# Patient Record
Sex: Male | Born: 1990 | Race: White | Hispanic: No | Marital: Married | Smoking: Never smoker
Health system: Southern US, Community
[De-identification: ages and names within clinical notes are randomized; demographics above are authoritative.]

## PROBLEM LIST (undated history)

## (undated) DIAGNOSIS — F909 Attention-deficit hyperactivity disorder, unspecified type: Secondary | ICD-10-CM

## (undated) DIAGNOSIS — F1910 Other psychoactive substance abuse, uncomplicated: Secondary | ICD-10-CM

## (undated) DIAGNOSIS — F1410 Cocaine abuse, uncomplicated: Secondary | ICD-10-CM

---

## 2012-10-05 ENCOUNTER — Emergency Department (HOSPITAL_COMMUNITY)
Admission: EM | Admit: 2012-10-05 | Discharge: 2012-10-05 | Disposition: A | Discharge status: Home / Self Care | Payer: Self-pay | Attending: Emergency Medicine | Admitting: Emergency Medicine

## 2012-10-05 ENCOUNTER — Other Ambulatory Visit: Payer: Self-pay

## 2012-10-05 ENCOUNTER — Encounter (HOSPITAL_COMMUNITY): Payer: Self-pay | Admitting: Emergency Medicine

## 2012-10-05 DIAGNOSIS — R000 Tachycardia, unspecified: Secondary | ICD-10-CM | POA: Insufficient documentation

## 2012-10-05 DIAGNOSIS — F17200 Nicotine dependence, unspecified, uncomplicated: Secondary | ICD-10-CM | POA: Insufficient documentation

## 2012-10-05 DIAGNOSIS — F411 Generalized anxiety disorder: Secondary | ICD-10-CM | POA: Insufficient documentation

## 2012-10-05 LAB — CBC WITH DIFFERENTIAL/PLATELET
Basophils Absolute: 0 10*3/uL (ref 0.0–0.1)
Basophils Relative: 0 % (ref 0–1)
Eosinophils Relative: 1 % (ref 0–5)
HCT: 41.8 % (ref 39.0–52.0)
MCHC: 35.4 g/dL (ref 30.0–36.0)
MCV: 83.4 fL (ref 78.0–100.0)
Monocytes Absolute: 0.8 10*3/uL (ref 0.1–1.0)
Platelets: 298 10*3/uL (ref 150–400)
RDW: 12.4 % (ref 11.5–15.5)

## 2012-10-05 LAB — ETHANOL: Alcohol, Ethyl (B): <11 mg/dL (ref 0–11)

## 2012-10-05 LAB — BASIC METABOLIC PANEL
Calcium: 9.3 mg/dL (ref 8.4–10.5)
Creatinine, Ser: 0.96 mg/dL (ref 0.50–1.35)
GFR calc Af Amer: >90 mL/min (ref >90)
GFR calc non Af Amer: >90 mL/min (ref >90)

## 2012-10-05 MED ORDER — ADENOSINE 6 MG/2ML IV SOLN
12.0000 mg | Freq: Once | INTRAVENOUS | Status: AC
  Administered 2012-10-05: 12 mg via INTRAVENOUS
  Filled 2012-10-05: qty 4

## 2012-10-05 MED ORDER — SODIUM CHLORIDE 0.9 % IV BOLUS (SEPSIS)
1000.0000 mL | Freq: Once | INTRAVENOUS | Status: AC
  Administered 2012-10-05: 1000 mL via INTRAVENOUS

## 2012-10-05 MED ORDER — METOPROLOL TARTRATE 1 MG/ML IV SOLN
5.0000 mg | Freq: Once | INTRAVENOUS | Status: AC
  Administered 2012-10-05: 5 mg via INTRAVENOUS
  Filled 2012-10-05: qty 5
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
## 2013-05-09 ENCOUNTER — Encounter (HOSPITAL_COMMUNITY): Payer: Self-pay | Admitting: Emergency Medicine

## 2013-05-09 ENCOUNTER — Emergency Department (INDEPENDENT_AMBULATORY_CARE_PROVIDER_SITE_OTHER)
Admission: EM | Admit: 2013-05-09 | Discharge: 2013-05-09 | Disposition: A | Discharge status: Home / Self Care | Payer: Self-pay | Source: Home / Self Care

## 2013-05-09 DIAGNOSIS — A088 Other specified intestinal infections: Secondary | ICD-10-CM

## 2013-05-09 DIAGNOSIS — A084 Viral intestinal infection, unspecified: Secondary | ICD-10-CM

## 2013-05-09 MED ORDER — ONDANSETRON 4 MG PO TBDP
ORAL_TABLET | ORAL | Status: AC
  Filled 2013-05-09: qty 1

## 2013-05-09 MED ORDER — ONDANSETRON HCL 4 MG PO TABS: 4.0000 mg | ORAL_TABLET | Freq: Four times a day (QID) | ORAL | Status: DC

## 2013-05-09 MED ORDER — ONDANSETRON 4 MG PO TBDP
4.0000 mg | ORAL_TABLET | Freq: Once | ORAL | Status: AC
  Administered 2013-05-09: 4 mg via ORAL

## 2013-05-09 NOTE — ED Notes (Signed)
Vomiting and diarrhea since 5 am.

## 2013-05-09 NOTE — ED Provider Notes (Signed)
CSN: [id number]     Arrival date & time 05/09/13  1355 History   First MD Initiated Contact with Patient 05/09/13 1458     No chief complaint on file.  (Consider location/radiation/quality/duration/timing/severity/associated sxs/prior Treatment) HPI Comments: 23-year-old male who awoke this morning at 22 hours with sudden vomiting. He has vomited too numerous to count throughout the day. He feels a "gas pocket" in his chest along with burning. This was followed by diarrhea having about 5 pounds today. No blood. Denies fever.   No past medical history on file. No past surgical history on file. No family history on file. History  Substance Use Topics  . Smoking status: Current Every Day Smoker -- 1.00 packs/day    Types: Cigarettes  . Smokeless tobacco: Not on file  . Alcohol Use: Yes     Comment: socially    Review of Systems  Constitutional: Positive for activity change and appetite change. Negative for fever.  HENT: Negative.   Respiratory: Negative for cough and shortness of breath.   Cardiovascular: Negative for chest pain and palpitations.  Gastrointestinal: Positive for nausea, vomiting, abdominal pain and diarrhea. Negative for constipation, blood in stool and abdominal distention.       Intermittent mid to lower abdominal cramping.  Genitourinary: Negative.   Neurological: Negative.     Allergies  Other  Home Medications   Current Outpatient Rx  Name  Route  Sig  Dispense  Refill  . acetaminophen (TYLENOL) 500 MG tablet   Oral   Take 1,500 mg by mouth every 6 (six) hours as needed for pain.         Marland Kitchen. ondansetron (ZOFRAN) 4 MG tablet   Oral   Take 1 tablet (4 mg total) by mouth every 6 (six) hours.   12 tablet   0    BP 133/87  Pulse 104  Temp(Src) 99 F (37.2 C) (Oral)  Resp 16  SpO2 99% Physical Exam  Nursing note and vitals reviewed. Constitutional: He is oriented to person, place, and time. He appears well-developed and well-nourished. No distress.   HENT:  Mouth/Throat: Oropharynx is clear and moist. No oropharyngeal exudate.  Eyes: Conjunctivae and EOM are normal.  Neck: Normal range of motion. Neck supple.  Cardiovascular: Regular rhythm and normal heart sounds.   No murmur heard. Borderline tachycardia  Pulmonary/Chest: Effort normal and breath sounds normal. No respiratory distress. He has no wheezes. He has no rales.  Abdominal: Soft. Bowel sounds are normal. He exhibits mass. He exhibits no distension. There is no tenderness. There is no rebound and no guarding.  Musculoskeletal: He exhibits no edema and no tenderness.  Lymphadenopathy:    He has no cervical adenopathy.  Neurological: He is alert and oriented to person, place, and time.  Skin: Skin is warm and dry.  pallor  Psychiatric: He has a normal mood and affect.    ED Course  Procedures (including critical care time) Labs Review Labs Reviewed - No data to display Imaging Review No results found.   MDM   1. Viral gastroenteritis      Zofran here and q 6-8h prn Clear liquids small and frequent amts Pedialyte, gatorade immodium ad only as directed and >4 stools a day. Do not stop the diarrhea.  Hayden Rasmussenavid Giordan Fordham, NP 05/09/13 [phone]

## 2013-05-09 NOTE — ED Provider Notes (Signed)
Medical screening examination/treatment/procedure(s) were performed by non-physician practitioner and as supervising physician I was immediately available for consultation/collaboration.  Phelan Schadt, M.D.  Jahnessa Vanduyn C Kosha Jaquith, MD 05/09/13 1857 

## 2013-05-09 NOTE — Discharge Instructions (Signed)
Viral Gastroenteritis Pedialyte, clear liquids Viral gastroenteritis is also known as stomach flu. This condition affects the stomach and intestinal tract. It can cause sudden diarrhea and vomiting. The illness typically lasts 3 to 8 days. Most people develop an immune response that eventually gets rid of the virus. While this natural response develops, the virus can make you quite ill. CAUSES  Many different viruses can cause gastroenteritis, such as rotavirus or noroviruses. You can catch one of these viruses by consuming contaminated food or water. You may also catch a virus by sharing utensils or other personal items with an infected person or by touching a contaminated surface. SYMPTOMS  The most common symptoms are diarrhea and vomiting. These problems can cause a severe loss of body fluids (dehydration) and a body salt (electrolyte) imbalance. Other symptoms may include:  Fever.  Headache.  Fatigue.  Abdominal pain. DIAGNOSIS  Your caregiver can usually diagnose viral gastroenteritis based on your symptoms and a physical exam. A stool sample may also be taken to test for the presence of viruses or other infections. TREATMENT  This illness typically goes away on its own. Treatments are aimed at rehydration. The most serious cases of viral gastroenteritis involve vomiting so severely that you are not able to keep fluids down. In these cases, fluids must be given through an intravenous line (IV). HOME CARE INSTRUCTIONS   Drink enough fluids to keep your urine clear or pale yellow. Drink small amounts of fluids frequently and increase the amounts as tolerated.  Ask your caregiver for specific rehydration instructions.  Avoid:  Foods high in sugar.  Alcohol.  Carbonated drinks.  Tobacco.  Juice.  Caffeine drinks.  Extremely hot or cold fluids.  Fatty, greasy foods.  Too much intake of anything at one time.  Dairy products until 24 to 48 hours after diarrhea  stops.  You may consume probiotics. Probiotics are active cultures of beneficial bacteria. They may lessen the amount and number of diarrheal stools in adults. Probiotics can be found in yogurt with active cultures and in supplements.  Wash your hands well to avoid spreading the virus.  Only take over-the-counter or prescription medicines for pain, discomfort, or fever as directed by your caregiver. Do not give aspirin to children. Antidiarrheal medicines are not recommended.  Ask your caregiver if you should continue to take your regular prescribed and over-the-counter medicines.  Keep all follow-up appointments as directed by your caregiver. SEEK IMMEDIATE MEDICAL CARE IF:   You are unable to keep fluids down.  You do not urinate at least once every 6 to 8 hours.  You develop shortness of breath.  You notice blood in your stool or vomit. This may look like coffee grounds.  You have abdominal pain that increases or is concentrated in one small area (localized).  You have persistent vomiting or diarrhea.  You have a fever.  The patient is a child younger than 3 months, and he or she has a fever.  The patient is a child older than 3 months, and he or she has a fever and persistent symptoms.  The patient is a child older than 3 months, and he or she has a fever and symptoms suddenly get worse.  The patient is a baby, and he or she has no tears when crying. MAKE SURE YOU:   Understand these instructions.  Will watch your condition.  Will get help right away if you are not doing well or get worse. Document Released: 01/24/2005 Document Revised: 04/18/2011  Document Reviewed: 11/10/2010 [facility] Patient Information 2014 [place], [place].  Diet for Diarrhea, Adult Dont start this unless your vomiting has stopped but continue to have diarrhea. Frequent, runny stools (diarrhea) may be caused or worsened by food or drink. Diarrhea may be relieved by changing your diet. Since  diarrhea can last up to 7 days, it is easy for you to lose too much fluid from the body and become dehydrated. Fluids that are lost need to be replaced. Along with a modified diet, make sure you drink enough fluids to keep your urine clear or pale yellow. DIET INSTRUCTIONS  Ensure adequate fluid intake (hydration): have 1 cup (8 oz) of fluid for each diarrhea episode. Avoid fluids that contain simple sugars or sports drinks, fruit juices, whole milk products, and sodas. Your urine should be clear or pale yellow if you are drinking enough fluids. Hydrate with an oral rehydration solution that you can purchase at pharmacies, retail stores, and online. You can prepare an oral rehydration solution at home by mixing the following ingredients together:    tsp table salt.   tsp baking soda.   tsp salt substitute containing potassium chloride.  1  tablespoons sugar.  1 L (34 oz) of water.  Certain foods and beverages may increase the speed at which food moves through the gastrointestinal (GI) tract. These foods and beverages should be avoided and include:  Caffeinated and alcoholic beverages.  High-fiber foods, such as raw fruits and vegetables, nuts, seeds, and whole grain breads and cereals.  Foods and beverages sweetened with sugar alcohols, such as xylitol, sorbitol, and mannitol.  Some foods may be well tolerated and may help thicken stool including:  Starchy foods, such as rice, toast, pasta, low-sugar cereal, oatmeal, grits, baked potatoes, crackers, and bagels.   Bananas.   Applesauce.  Add probiotic-rich foods to help increase healthy bacteria in the GI tract, such as yogurt and fermented milk products. RECOMMENDED FOODS AND BEVERAGES Starches Choose foods with less than 2 g of fiber per serving.  Recommended:  White, [place], and pita breads, plain rolls, buns, bagels. Plain muffins, matzo. Soda, saltine, or graham crackers. Pretzels, melba toast, zwieback. Cooked cereals made  with water: cornmeal, farina, cream cereals. Dry cereals: refined corn, wheat, rice. Potatoes prepared any way without skins, refined macaroni, spaghetti, noodles, refined rice.  Avoid:  Bread, rolls, or crackers made with whole wheat, multi-grains, rye, bran seeds, nuts, or coconut. Corn tortillas or taco shells. Cereals containing whole grains, multi-grains, bran, coconut, nuts, raisins. Cooked or dry oatmeal. Coarse wheat cereals, granola. Cereals advertised as "high-fiber." Potato skins. Whole grain pasta, wild or brown rice. Popcorn. Sweet potatoes, yams. Sweet rolls, doughnuts, waffles, pancakes, sweet breads. Vegetables  Recommended: Strained tomato and vegetable juices. Most well-cooked and canned vegetables without seeds. Fresh: Tender lettuce, cucumber without the skin, cabbage, spinach, bean sprouts.  Avoid: Fresh, cooked, or canned: Artichokes, baked beans, beet greens, broccoli, Brussels sprouts, corn, kale, legumes, peas, sweet potatoes. Cooked: Green or red cabbage, spinach. Avoid large servings of any vegetables because vegetables shrink when cooked, and they contain more fiber per serving than fresh vegetables. Fruit  Recommended: Cooked or canned: Apricots, applesauce, cantaloupe, cherries, fruit cocktail, grapefruit, grapes, kiwi, mandarin oranges, peaches, pears, plums, watermelon. Fresh: Apples without skin, ripe banana, grapes, cantaloupe, cherries, grapefruit, peaches, oranges, plums. Keep servings limited to  cup or 1 piece.  Avoid: Fresh: Apples with skin, apricots, mangoes, pears, raspberries, strawberries. Prune juice, stewed or dried prunes. Dried fruits, raisins, dates. Large  servings of all fresh fruits. Protein  Recommended: Ground or well-cooked tender beef, ham, veal, lamb, pork, or poultry. Eggs. Fish, oysters, shrimp, lobster, other seafoods. Liver, organ meats.  Avoid: Tough, fibrous meats with gristle. Peanut butter, smooth or chunky. Cheese, nuts, seeds,  legumes, dried peas, beans, lentils. Dairy  Recommended: Yogurt, lactose-free milk, kefir, drinkable yogurt, buttermilk, soy milk, or plain hard cheese.  Avoid: Milk, chocolate milk, beverages made with milk, such as milkshakes. Soups  Recommended: Bouillon, broth, or soups made from allowed foods. Any strained soup.  Avoid: Soups made from vegetables that are not allowed, cream or milk-based soups. Desserts and Sweets  Recommended: Sugar-free gelatin, sugar-free frozen ice pops made without sugar alcohol.  Avoid: Plain cakes and cookies, pie made with fruit, pudding, custard, cream pie. Gelatin, fruit, ice, sherbet, frozen ice pops. Ice cream, ice milk without nuts. Plain hard candy, honey, jelly, molasses, syrup, sugar, chocolate syrup, gumdrops, marshmallows. Fats and Oils  Recommended: Limit fats to less than 8 tsp per day.  Avoid: Seeds, nuts, olives, avocados. Margarine, butter, cream, mayonnaise, salad oils, plain salad dressings. Plain gravy, crisp bacon without rind. Beverages  Recommended: Water, decaffeinated teas, oral rehydration solutions, sugar-free beverages not sweetened with sugar alcohols.  Avoid: Fruit juices, caffeinated beverages (coffee, tea, soda), alcohol, sports drinks, or lemon-lime soda. Condiments  Recommended: Ketchup, mustard, horseradish, vinegar, cocoa powder. Spices in moderation: allspice, basil, bay leaves, celery powder or leaves, cinnamon, cumin powder, curry powder, ginger, mace, marjoram, onion or garlic powder, oregano, paprika, parsley flakes, ground pepper, rosemary, sage, savory, tarragon, thyme, turmeric.  Avoid: Coconut, honey. Document Released: 04/16/2003 Document Revised: 10/19/2011 Document Reviewed: 06/10/2011 [facility] Patient Information 2014 [place].

## 2013-07-13 ENCOUNTER — Emergency Department (HOSPITAL_COMMUNITY): Payer: Self-pay

## 2013-07-13 ENCOUNTER — Emergency Department (HOSPITAL_COMMUNITY)
Admission: EM | Admit: 2013-07-13 | Discharge: 2013-07-13 | Disposition: A | Discharge status: Home / Self Care | Payer: Self-pay | Attending: Emergency Medicine | Admitting: Emergency Medicine

## 2013-07-13 ENCOUNTER — Encounter (HOSPITAL_COMMUNITY): Payer: Self-pay | Admitting: Emergency Medicine

## 2013-07-13 DIAGNOSIS — F1110 Opioid abuse, uncomplicated: Secondary | ICD-10-CM | POA: Insufficient documentation

## 2013-07-13 DIAGNOSIS — Y9389 Activity, other specified: Secondary | ICD-10-CM | POA: Insufficient documentation

## 2013-07-13 DIAGNOSIS — F1010 Alcohol abuse, uncomplicated: Secondary | ICD-10-CM | POA: Insufficient documentation

## 2013-07-13 DIAGNOSIS — S0990XA Unspecified injury of head, initial encounter: Secondary | ICD-10-CM | POA: Insufficient documentation

## 2013-07-13 DIAGNOSIS — H05231 Hemorrhage of right orbit: Secondary | ICD-10-CM

## 2013-07-13 DIAGNOSIS — S0003XA Contusion of scalp, initial encounter: Secondary | ICD-10-CM | POA: Insufficient documentation

## 2013-07-13 DIAGNOSIS — Y9289 Other specified places as the place of occurrence of the external cause: Secondary | ICD-10-CM | POA: Insufficient documentation

## 2013-07-13 DIAGNOSIS — Z8659 Personal history of other mental and behavioral disorders: Secondary | ICD-10-CM | POA: Insufficient documentation

## 2013-07-13 DIAGNOSIS — F17200 Nicotine dependence, unspecified, uncomplicated: Secondary | ICD-10-CM | POA: Insufficient documentation

## 2013-07-13 DIAGNOSIS — F1210 Cannabis abuse, uncomplicated: Secondary | ICD-10-CM | POA: Insufficient documentation

## 2013-07-13 DIAGNOSIS — S1093XA Contusion of unspecified part of neck, initial encounter: Secondary | ICD-10-CM

## 2013-07-13 DIAGNOSIS — F1310 Sedative, hypnotic or anxiolytic abuse, uncomplicated: Secondary | ICD-10-CM | POA: Insufficient documentation

## 2013-07-13 DIAGNOSIS — S0083XA Contusion of other part of head, initial encounter: Secondary | ICD-10-CM | POA: Insufficient documentation

## 2013-07-13 DIAGNOSIS — S0510XA Contusion of eyeball and orbital tissues, unspecified eye, initial encounter: Secondary | ICD-10-CM | POA: Insufficient documentation

## 2013-07-13 DIAGNOSIS — R4182 Altered mental status, unspecified: Secondary | ICD-10-CM | POA: Insufficient documentation

## 2013-07-13 HISTORY — DX: Attention-deficit hyperactivity disorder, unspecified type: F90.9

## 2013-07-13 LAB — RAPID URINE DRUG SCREEN, HOSP PERFORMED
Amphetamines: NOT DETECTED
BARBITURATES: NOT DETECTED
BENZODIAZEPINES: POSITIVE — AB
COCAINE: NOT DETECTED
OPIATES: POSITIVE — AB
Tetrahydrocannabinol: POSITIVE — AB

## 2013-07-13 MED ORDER — NAPROXEN 500 MG PO TABS: 500.0000 mg | ORAL_TABLET | Freq: Two times a day (BID) | ORAL | Status: DC

## 2013-07-13 NOTE — ED Provider Notes (Signed)
Medical screening examination/treatment/procedure(s) were performed by non-physician practitioner and as supervising physician I was immediately available for consultation/collaboration.   EKG Interpretation None        Emmabelle Fear, MD 07/13/13 2302 
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
## 2013-08-21 ENCOUNTER — Emergency Department (HOSPITAL_COMMUNITY)
Admission: EM | Admit: 2013-08-21 | Discharge: 2013-08-21 | Disposition: A | Discharge status: Home / Self Care | Payer: Self-pay | Attending: Emergency Medicine | Admitting: Emergency Medicine

## 2013-08-21 ENCOUNTER — Encounter (HOSPITAL_COMMUNITY): Payer: Self-pay | Admitting: Emergency Medicine

## 2013-08-21 DIAGNOSIS — Z8719 Personal history of other diseases of the digestive system: Secondary | ICD-10-CM | POA: Insufficient documentation

## 2013-08-21 DIAGNOSIS — F1210 Cannabis abuse, uncomplicated: Secondary | ICD-10-CM | POA: Insufficient documentation

## 2013-08-21 DIAGNOSIS — F17200 Nicotine dependence, unspecified, uncomplicated: Secondary | ICD-10-CM | POA: Insufficient documentation

## 2013-08-21 DIAGNOSIS — Y9389 Activity, other specified: Secondary | ICD-10-CM | POA: Insufficient documentation

## 2013-08-21 DIAGNOSIS — T401X4A Poisoning by heroin, undetermined, initial encounter: Secondary | ICD-10-CM | POA: Insufficient documentation

## 2013-08-21 DIAGNOSIS — F1310 Sedative, hypnotic or anxiolytic abuse, uncomplicated: Secondary | ICD-10-CM | POA: Insufficient documentation

## 2013-08-21 DIAGNOSIS — T50901A Poisoning by unspecified drugs, medicaments and biological substances, accidental (unintentional), initial encounter: Secondary | ICD-10-CM

## 2013-08-21 DIAGNOSIS — F1110 Opioid abuse, uncomplicated: Secondary | ICD-10-CM | POA: Insufficient documentation

## 2013-08-21 DIAGNOSIS — Y9289 Other specified places as the place of occurrence of the external cause: Secondary | ICD-10-CM | POA: Insufficient documentation

## 2013-08-21 DIAGNOSIS — Z8659 Personal history of other mental and behavioral disorders: Secondary | ICD-10-CM | POA: Insufficient documentation

## 2013-08-21 DIAGNOSIS — T401X1A Poisoning by heroin, accidental (unintentional), initial encounter: Secondary | ICD-10-CM | POA: Insufficient documentation

## 2013-08-21 DIAGNOSIS — R000 Tachycardia, unspecified: Secondary | ICD-10-CM | POA: Insufficient documentation

## 2013-08-21 HISTORY — DX: Cocaine abuse, uncomplicated: F14.10

## 2013-08-21 LAB — ETHANOL

## 2013-08-21 LAB — CBC
HCT: 44.8 % (ref 39.0–52.0)
Hemoglobin: 15.3 g/dL (ref 13.0–17.0)
MCH: 29.7 pg (ref 26.0–34.0)
MCHC: 34.2 g/dL (ref 30.0–36.0)
MCV: 86.8 fL (ref 78.0–100.0)
PLATELETS: 293 10*3/uL (ref 150–400)
RBC: 5.16 MIL/uL (ref 4.22–5.81)
RDW: 12.4 % (ref 11.5–15.5)
WBC: 29.7 10*3/uL — ABNORMAL HIGH (ref 4.0–10.5)

## 2013-08-21 LAB — COMPREHENSIVE METABOLIC PANEL
ALBUMIN: 3.9 g/dL (ref 3.5–5.2)
ALT: 26 U/L (ref 0–53)
AST: 27 U/L (ref 0–37)
Alkaline Phosphatase: 104 U/L (ref 39–117)
Anion gap: 16 — ABNORMAL HIGH (ref 5–15)
BUN: 9 mg/dL (ref 6–23)
CALCIUM: 9 mg/dL (ref 8.4–10.5)
CO2: 27 meq/L (ref 19–32)
CREATININE: 0.83 mg/dL (ref 0.50–1.35)
Chloride: 101 mEq/L (ref 96–112)
GFR calc Af Amer: >90 mL/min (ref >90)
Glucose, Bld: 66 mg/dL — ABNORMAL LOW (ref 70–99)
Potassium: 4.7 mEq/L (ref 3.7–5.3)
SODIUM: 144 meq/L (ref 137–147)
TOTAL PROTEIN: 7.8 g/dL (ref 6.0–8.3)
Total Bilirubin: 0.4 mg/dL (ref 0.3–1.2)

## 2013-08-21 LAB — ACETAMINOPHEN LEVEL

## 2013-08-21 LAB — RAPID URINE DRUG SCREEN, HOSP PERFORMED
AMPHETAMINES: NOT DETECTED
BARBITURATES: NOT DETECTED
BENZODIAZEPINES: POSITIVE — AB
Cocaine: NOT DETECTED
Opiates: POSITIVE — AB
TETRAHYDROCANNABINOL: POSITIVE — AB

## 2013-08-21 LAB — SALICYLATE LEVEL: Salicylate Lvl: <2 mg/dL — ABNORMAL LOW (ref 2.8–20.0)

## 2013-08-21 MED ORDER — NICOTINE 21 MG/24HR TD PT24
21.0000 mg | MEDICATED_PATCH | Freq: Once | TRANSDERMAL | Status: DC
  Administered 2013-08-21: 21 mg via TRANSDERMAL
  Filled 2013-08-21: qty 1

## 2013-08-21 NOTE — ED Notes (Signed)
Pt states that he wants to check himself out of hospital dr into speak to pt again
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
## 2013-08-21 NOTE — Discharge Instructions (Signed)
Substance Abuse °Your exam indicates that you have a problem with substance abuse. Substance abuse is the misuse of alcohol or drugs that causes problems in family life, friendships, and work relationships. Substance abuse is the most important cause of premature illness, disability, and death in our society. It is also the greatest threat to a person's mental and spiritual well being. °Substance abuse can start out in an innocent way, such as social drinking or taking a little extra medication prescribed by your doctor. No one starts out with the intention of becoming an alcoholic or an addict. Substance abuse victims cannot control their use of alcohol or drugs. They may become intoxicated daily or go on weekend binges. Often there is a strong desire to quit, but attempts to stop using often fail. Encounters with law enforcement or conflicts with family members, friends, and work associates are signs of a potential problem. °Recovery is always possible, although the craving for some drugs makes it difficult to quit without assistance. Many treatment programs are available to help people stop abusing alcohol or drugs. The first step in treatment is to admit you have a problem. This is a major hurdle because denial is a powerful force with substance abuse. °Alcoholics Anonymous, Narcotics Anonymous, Cocaine Anonymous, and other recovery groups and programs can be very useful in helping people to quit. If you do not feel okay about your drug or alcohol use and if it is causing you trouble, we want to encourage you to talk about it with your doctor or with someone from a recovery group who can help you. You could also call the National Institute on Drug Abuse at 1-800-662-HELP. It is up to you to take the first step. °AL-ANON and ALA-TEEN are support groups for friends and family members of an alcohol or drug dependent person. The people who love and care for the alcoholic or addicted person often need help, too. For  information about these organizations, check your phone directory or call a local alcohol or drug treatment center. °Document Released: 03/03/2004 Document Revised: 04/18/2011 Document Reviewed: 01/26/2008 °ExitCare® Patient Information ©2015 [employer]. This information is not intended to replace advice given to you by your health care provider. Make sure you discuss any questions you have with your health care provider. ° ° ° °Emergency Department Resource Guide °1) Find a Doctor and Pay Out of Pocket °Although you won't have to find out who is covered by your insurance plan, it is a good idea to ask around and get recommendations. You will then need to call the office and see if the doctor you have chosen will accept you as a new patient and what types of options they offer for patients who are self-pay. Some doctors offer discounts or will set up payment plans for their patients who do not have insurance, but you will need to ask so you aren't surprised when you get to your appointment. ° °2) Contact Your Local Health Department °Not all health departments have doctors that can see patients for sick visits, but many do, so it is worth a call to see if yours does. If you don't know where your local health department is, you can check in your phone book. The CDC also has a tool to help you locate your state's health department, and many state websites also have listings of all of their local health departments. ° °3) Find a Walk-in Clinic °If your illness is not likely to be very severe or complicated, you   may want to try a walk in clinic. These are popping up all over the country in pharmacies, drugstores, and shopping centers. They're usually staffed by nurse practitioners or physician assistants that have been trained to treat common illnesses and complaints. They're usually fairly quick and inexpensive. However, if you have serious medical issues or chronic medical problems, these are probably not your best  option. ° °No Primary Care Doctor: °- Call [facility] at  832-8000 - they can help you locate a primary care doctor that  accepts your insurance, provides certain services, etc. °- Physician Referral Service- 1-800-533-3463 ° °Chronic Pain Problems: °Organization         Address  Phone   Notes  °[facility]  (336) 297-2271 Patients need to be referred by their primary care doctor.  ° °Medication Assistance: °Organization         Address  Phone   Notes  °[place] [facility] [address] °[place], [place] [address] (336) 641-8030 --Must be a resident of [place] °-- Must have NO insurance coverage whatsoever (no Medicaid/ Medicare, etc.) °-- The pt. MUST have a primary care doctor that directs their care regularly and follows them in the community °  °[facility]  (866) 331-1348   °[employer]  (888) 892-1162   ° °Agencies that provide inexpensive medical care: °Organization         Address  Phone   Notes  °[facility]  (336) 832-8035   °[facility]    (336) 832-7272   °[facility] [address] °[place], [place] [address] (336) 832-4777   °[facility] [address], °[place] (336) 271-4999   °[facility]    (336) 373-0678   °[facility]    (336) 272-1050   °[facility] ° [address], [place] Phone:  (336) 832-4444, Fax:  (336) 832-4440 Hours of Operation:  9 am - 6 pm, M-F.  Also accepts Medicaid/Medicare and self-pay.  °[facility] ° [address], [place] Phone: (336) 832-3150, Fax: (336) 832-3151. Hours of Operation:  8:30 am - 5:30 pm, M-F.  Also accepts Medicaid and self-pay.  °[facility] [place] [address], [place] Phone: (336) 878-6027   °[facility] [address], [place], [place] (336)723-1848, Ext. 123 Mondays & Thursdays: 7-9 AM.  First 15  patients are seen on a first come, first serve basis. °  ° °Medicaid-accepting [place] Providers: ° °Organization         Address  Phone   Notes  °[facility] [address], [place] (336) 641-2100 Also accepts self-pay patients.  °[facility] [address], [place] ° (336) 856-9996   °[facility] [address], [place] (336) 288-8857   °[facility] [address] [place] [address], [place] (336) 299-7000   °Veita Bland [address], [place]  ° (336) 373-1557 Only accepts [place] Access Medicaid patients after they have their name applied to their card.  ° °Self-Pay (no insurance) in [place]: ° °Organization         Address  Phone   Notes  °Sickle Cell Patients, [facility] [address], [place] (336) 832-1970   °[facility] [address], [place] (  336) 832-4400   °[facility]  ° [address],  (336) 992-4800   °[facility]/Dr. Osei-Bonsu ° [address] [place] [address], [place] or [address], [place] (336) 841-8500 Phone number for both [place] and [place] locations is the same.  °[facility] [address], [place] (336) 299-0000   °[facility] [address] [place] [address], [place] or [address] (336) 852-7530 °(336) 878-2260   °[facility] [address], [place] (336) 350-1642, phone; (336) 294-5005, fax Sees patients 1st and 3rd Saturday of every month.  Must not qualify for public or private insurance (i.e. Medicaid, Medicare, [place] Health Choice, Veterans' Benefits) • Household income should be no more than 200% of the poverty level •The clinic cannot treat you if you are pregnant or think you are pregnant • Sexually transmitted diseases are not treated at the clinic.  ° ° °Dental  Care: °Organization         Address  Phone  Notes  °[facility] [address], [place] (336) 641-6152 Accepts children up to age 21 who are enrolled in Medicaid or [place] Health Choice; pregnant women with a Medicaid card; and children who have applied for Medicaid or [place] Health Choice, but were declined, whose parents can pay a reduced fee at time of service.  °[facility] [place]  [address], [place] (336) 641-7733 Accepts children up to age 21 who are enrolled in Medicaid or [place] Health Choice; pregnant women with a Medicaid card; and children who have applied for Medicaid or [place] Health Choice, but were declined, whose parents can pay a reduced fee at time of service.  °[facility] ° [address], [place] (336) 641-4533 Patients are seen by appointment only. Walk-ins are not accepted. [facility] will see patients 18 years of age and older. °Monday - Tuesday (8am-5pm) °Most Wednesdays (8:30-5pm) °$30 per visit, cash only  °[facility] ° [address], [place] (336) 641-4533 Patients are seen by appointment only. Walk-ins are not accepted. [facility] will see patients 18 years of age and older. °One Wednesday Evening (Monthly: Volunteer Based).  $30 per visit, cash only  °[facility]  (919) 537-3737 for adults; Children under age 4, call Graduate Pediatric Dentistry at (919) [phone]. Children aged 4-14, please call (919) [phone] to request a pediatric application. ° Dental services are provided in all areas of dental care including fillings, crowns and bridges, complete and partial dentures, implants, gum treatment, root canals, and extractions. Preventive care is also provided. Treatment is provided to both adults and children. °Patients are selected via a lottery and there is often a waiting list. °  °[facility] [address], °[place] ° (336) 763-8833 [email or web address] °  °[facility] [address], [place], [place] (336)723-1848, Ext. 123 Second and Fourth Thursday of each month, opens at 6:30 AM; Clinic ends at 9 AM.  Patients are seen on a first-come first-served basis, and a limited number are seen during each clinic.  ° °[facility] ° [address], [place], [place] (336) 723-7904   Eligibility Requirements °You must have lived in [place], [place], or [place] counties for at least the last three months. °  You cannot be eligible for   state or federal sponsored healthcare insurance, including Veterans Administration, Medicaid, or Medicare. °  You generally cannot be eligible for healthcare insurance through your employer.  °  How to apply: °Eligibility screenings are held every Tuesday and Wednesday afternoon from 1:00 pm until 4:00 pm. You do not need an appointment for the interview!  °[facility] [address], [place], [place] 336-631-2330   °[facility]  336-342-8273   °[facility]  336-703-3100   °[facility]  336-570-6415   ° °Behavioral Health Resources in the Community: °Intensive Outpatient Programs °Organization         Address  Phone  Notes  °[facility] [address], [place], [place] 336-878-6098   °[facility] [address], [place], [place] 336-832-9800   °[facility] [address], [place], [place] ° 336-882-2125   °[facility] [address],  °[place], [place] 1-800-853-5163 or 336-641-4981   °Substance Abuse Resources °Organization         Address  Phone  Notes  °[facility]  336-882-2125   °[facility]  336-784-9470   °[facility]  336-285-9073   °[facility]  336-845-3988   °Residential & Outpatient Substance Abuse Program  1-800-659-3381    °Psychological Services °Organization         Address  Phone  Notes  °[facility]  336- 832-9600   °[facility]  336- 378-7881   °[facility] [address], [place] 1-800-853-5163 or 336-641-4981   ° °Mobile Crisis Teams °Organization         Address  Phone  Notes  °[facility], Mobile Crisis Care Unit  1-877-626-1772   °Assertive °[facility] ° [address] [place], [place] 336-834-9664   °Sharon DeEsch [address] °[place] 336-554-5454   ° °Self-Help/Support Groups °Organization         Address  Phone             Notes  °[facility] - variety of support groups  336- 373-1402 Call for more information  °Narcotics Anonymous (NA), [facility] [address], °[place]  2 meetings at this location  ° °Residential Treatment Programs °Organization         Address  Phone  Notes  °[facility] [address],    °[place]  1-866-801-8205   °[facility] ° [address], [place], [place] 704-293-8524   °[facility] [address], [place] 336-845-3988 Admissions: 8am-3pm M-F  °[facility] [address]    °[place], [place] 336-841-1104   °[facility] [address], [place], [place] 336-379-7146   °[facility] [address]  °[place], [place] 336-285-9073   °[facility] - Intensive Outpatient [address], [place], [place] 336-852-3033   °[facility] [address]  °[place],  1-877-615-2722 or 336-784-9470   °[facility] [address] [place],  336-227-7417 Accepts Medicaid  °[facility] [address]  °  1-800-659-3381 Substance Abuse/Addiction Treatment  ° °[place] Behavioral Health Resources °Organization         Address  Phone  Notes  °[facility]  (888) 581-9988   °Julie Brannon, PhD [address] [place],    (  336) 349-5553 or (336) 951-0000   °[facility]   [address] °[place], [place] (336) 349-4454   °[facility] [address], [place], [place] (336) 342-8316 Insurance/Medicaid/sponsorship through [facility]  °[facility] [address]                                    [place], [place] (336) 342-8316 Therapy/tele-psych/case  °[facility] [address]  ° [place], [place] (336) 349-2233    °Dr. Arfeen  (336) 349-4544   °[facility] [place]  [employer] [place] [facility] 1) [address], [place] °2) [address], [place] °3)  [address], [place] (336) 349-3220 °(336) 342-7768 ° °(336) 342-8140   °[place] [facility] (336) 342-1394 or (336) 342-3537 (After Hours)    ° ° ° °

## 2013-08-21 NOTE — ED Notes (Signed)
Pt eating sandwich without difficulty- pt remains drowsy at present.  Will d/c when pt more alert and able to ambulate without difficulty.

## 2013-08-21 NOTE — ED Notes (Signed)
Pt still attempting to call for a ride.
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
## 2013-08-28 NOTE — ED Provider Notes (Signed)
I saw and evaluated the patient, reviewed the resident's note and I agree with the findings and plan.   EKG Interpretation   Date/Time:  Wednesday August 21 2013 13:33:24 EDT Ventricular Rate:  120 PR Interval:  136 QRS Duration: 87 QT Interval:  326 QTC Calculation: 461 R Axis:   86 Text Interpretation:  Sinus tachycardia No significant change since last  tracing Confirmed by Denton LankSTEINL  MD, Caryn BeeKEVIN ([id number]) on 08/21/2013 2:44:54 PM      Pt presents after accidental od heroine. Was given narcan.  No nv, no choking, no sob.  Pt on several rechecks remains awake and alert, no resp depression.  Pt denies needing rehab/detox. Denies depression or any thought of harm to self.     Suzi RootsKevin E Lucia Mccreadie, MD 08/28/13 [phone]

## 2016-05-10 ENCOUNTER — Other Ambulatory Visit: Payer: Self-pay | Admitting: *Deleted

## 2016-05-10 ENCOUNTER — Ambulatory Visit
Admission: RE | Admit: 2016-05-10 | Discharge: 2016-05-10 | Disposition: A | Discharge status: Home / Self Care | Payer: No Typology Code available for payment source | Source: Ambulatory Visit | Attending: *Deleted | Admitting: *Deleted

## 2016-05-10 DIAGNOSIS — R7611 Nonspecific reaction to tuberculin skin test without active tuberculosis: Secondary | ICD-10-CM

## 2017-05-12 ENCOUNTER — Emergency Department (HOSPITAL_COMMUNITY): Payer: Self-pay

## 2017-05-12 ENCOUNTER — Other Ambulatory Visit: Payer: Self-pay

## 2017-05-12 ENCOUNTER — Emergency Department (HOSPITAL_COMMUNITY)
Admission: EM | Admit: 2017-05-12 | Discharge: 2017-05-12 | Disposition: A | Discharge status: Home / Self Care | Payer: Self-pay | Attending: Emergency Medicine | Admitting: Emergency Medicine

## 2017-05-12 ENCOUNTER — Encounter (HOSPITAL_COMMUNITY): Payer: Self-pay | Admitting: *Deleted

## 2017-05-12 DIAGNOSIS — F1990 Other psychoactive substance use, unspecified, uncomplicated: Secondary | ICD-10-CM

## 2017-05-12 DIAGNOSIS — F17210 Nicotine dependence, cigarettes, uncomplicated: Secondary | ICD-10-CM | POA: Insufficient documentation

## 2017-05-12 DIAGNOSIS — M79642 Pain in left hand: Secondary | ICD-10-CM | POA: Insufficient documentation

## 2017-05-12 DIAGNOSIS — F909 Attention-deficit hyperactivity disorder, unspecified type: Secondary | ICD-10-CM | POA: Insufficient documentation

## 2017-05-12 DIAGNOSIS — F1910 Other psychoactive substance abuse, uncomplicated: Secondary | ICD-10-CM | POA: Insufficient documentation

## 2017-05-12 MED ORDER — LIDOCAINE HCL (PF) 1 % IJ SOLN
5.0000 mL | Freq: Once | INTRAMUSCULAR | Status: AC
  Administered 2017-05-12: 5 mL
  Filled 2017-05-12: qty 5

## 2017-05-12 MED ORDER — TETANUS-DIPHTH-ACELL PERTUSSIS 5-2.5-18.5 LF-MCG/0.5 IM SUSP
0.5000 mL | Freq: Once | INTRAMUSCULAR | Status: DC
  Filled 2017-05-12: qty 0.5

## 2017-05-12 NOTE — ED Provider Notes (Signed)
[facility] EMERGENCY DEPARTMENT Provider Note   CSN: [id number] Arrival date & time: 05/12/17  1118     History   Chief Complaint Chief Complaint  Patient presents with  . Hand Pain    HPI Gregg Holmes is a 27 y.o. male who presents to the ED with left hand pain. Patient reports injecting heroine into hs left hand 3 days ago and missed the vein. Patient reports swelling and pain to the hand. Patient denies fever or chills.  HPI  Past Medical History:  Diagnosis Date  . ADHD (attention deficit hyperactivity disorder)   . Cocaine abuse (HCC)     There are no active problems to display for this patient.   History reviewed. No pertinent surgical history.      Home Medications    Prior to Admission medications   Not on File    Family History No family history on file.  Social History Social History   Tobacco Use  . Smoking status: Current Every Day Smoker    Packs/day: 1.00    Types: Cigarettes  . Smokeless tobacco: Never Used  Substance Use Topics  . Alcohol use: Yes    Comment: socially  . Drug use: Yes    Frequency: 1.0 times per week    Types: Marijuana    Comment: heroin     Allergies   Other   Review of Systems Review of Systems  Constitutional: Negative for chills and fever.  Musculoskeletal: Positive for arthralgias.       Left hand pain  All other systems reviewed and are negative.    Physical Exam Updated Vital Signs BP 140/69 (BP Location: Right Arm)   Pulse 96   Temp 98.7 F (37.1 C) (Oral)   Resp 16   Ht 5\' 9"  (1.753 m)   Wt 72.6 kg (160 lb)   SpO2 100%   BMI 23.63 kg/m   Physical Exam  Constitutional: He appears well-developed and well-nourished. No distress.  HENT:  Head: Normocephalic and atraumatic.  Eyes: EOM are normal.  Neck: Neck supple.  Cardiovascular: Normal rate.  Pulmonary/Chest: Effort normal.  Musculoskeletal: Normal range of motion.       Left hand: He exhibits tenderness and  swelling. He exhibits normal range of motion, normal capillary refill and no laceration. Normal sensation noted. Normal strength noted. He exhibits no thumb/finger opposition.       Hands: Neurological: He is alert.  Skin: Skin is warm and dry.  Psychiatric: He has a normal mood and affect.  Nursing note and vitals reviewed.    ED Treatments / Results  Labs (all labs ordered are listed, but only abnormal results are displayed) Labs Reviewed - No data to display  EKG None  Radiology Dg Hand Complete Left  Result Date: 05/12/2017 CLINICAL DATA:  Swelling after injecting heroin into the soft tissues of the hand EXAM: LEFT HAND - COMPLETE 3+ VIEW COMPARISON:  None. FINDINGS: Frontal, oblique, and lateral views obtained. There is soft tissue swelling along the dorsum of the hand. No fracture or dislocation. Joint spaces appear normal. No erosive change or bony destruction. No radiopaque foreign body. IMPRESSION: Soft tissue swelling dorsally. No radiopaque foreign body. No bony abnormality. In particular, no bony destruction or erosion. No fracture or dislocation. Electronically Signed   By: Bretta Bang III M.D.   On: 05/12/2017 12:22   I spoke with Leotis Shames, [facility] on call for hand and he saw the patient and questioned a possible abscess and  request I I&D the area.   Procedures .Marland Kitchen.Incision and Drainage Date/Time: 05/12/2017 3:31 PM Performed by: Janne NapoleonNeese, Hilmar Moldovan M, NP Authorized by: Janne NapoleonNeese, Estrellita Lasky M, NP   Consent:    Consent obtained:  Verbal   Consent given by:  Patient   Risks discussed:  Bleeding   Alternatives discussed:  No treatment Location:    Location:  Upper extremity   Upper extremity location:  Hand   Hand location:  L hand Pre-procedure details:    Skin preparation:  Betadine Anesthesia (see MAR for exact dosages):    Anesthesia method:  Local infiltration   Local anesthetic:  Lidocaine 1% w/o epi Procedure type:    Complexity:  Simple Procedure details:    Incision  types:  Single straight   Incision depth:  Dermal   Scalpel blade:  11   Wound management:  Probed and deloculated and irrigated with saline   Drainage:  Bloody   Drainage amount:  Scant   Wound treatment:  Wound left open Post-procedure details:    Patient tolerance of procedure:  Tolerated well, no immediate complications   (including critical care time)  Medications Ordered in ED Medications  Tdap (BOOSTRIX) injection 0.5 mL (0.5 mLs Intramuscular Refused 05/12/17 1533)  lidocaine (PF) (XYLOCAINE) 1 % injection 5 mL (5 mLs Infiltration Given 05/12/17 1530)   Consult with On call hand again, Leotis ShamesJeffery and discussed results of I&D. He does ot feel the patient needs antibiotics at this time since no purulent drainage. Dressing applied, ace wrap, ice and patient to f/u in the office for any further concerns. He will return here as needed. Patient agrees with plan.   Initial Impression / Assessment and Plan / ED Course  I have reviewed the triage vital signs and the nursing notes.   Final Clinical Impressions(s) / ED Diagnoses   Final diagnoses:  Left hand pain  IV drug user    ED Discharge Orders    None       Kerrie Buffaloeese, Korryn Pancoast WoodlandM, [place]NP 05/12/17 1534    Tegeler, Canary Brimhristopher J, MD 05/12/17 [phone]

## 2017-05-12 NOTE — ED Notes (Signed)
Pt injected heroin into left hand 3 days ago and missed, swelling to site since then. No redness noted and no drainage. Swelling and lump noted to site. Denies fevers or chills since then.

## 2017-05-12 NOTE — Discharge Instructions (Addendum)
Follow up with Dr. Amanda PeaGramig or return here if symptoms worsen.

## 2017-05-12 NOTE — ED Notes (Signed)
Pt states, "it was "dirty cotton" that I used."

## 2017-05-12 NOTE — ED Triage Notes (Signed)
Pt in c/o L hand pain, pt reports injecting heroine into L hand x 3 days and "missed" pt has swelling to L hand estimated 2cm x 2cm, no redness or drainage noted, denies fever and chills, A&O x4

## 2017-08-01 IMAGING — CR DG CHEST 2V
2 series · 2 of 2 positions shown · non-contrast
Comparison: None.

CLINICAL DATA: Positive PPD.  No chest complaints.  Smoker.

EXAM:
CHEST  2 VIEW

[w chest pa]
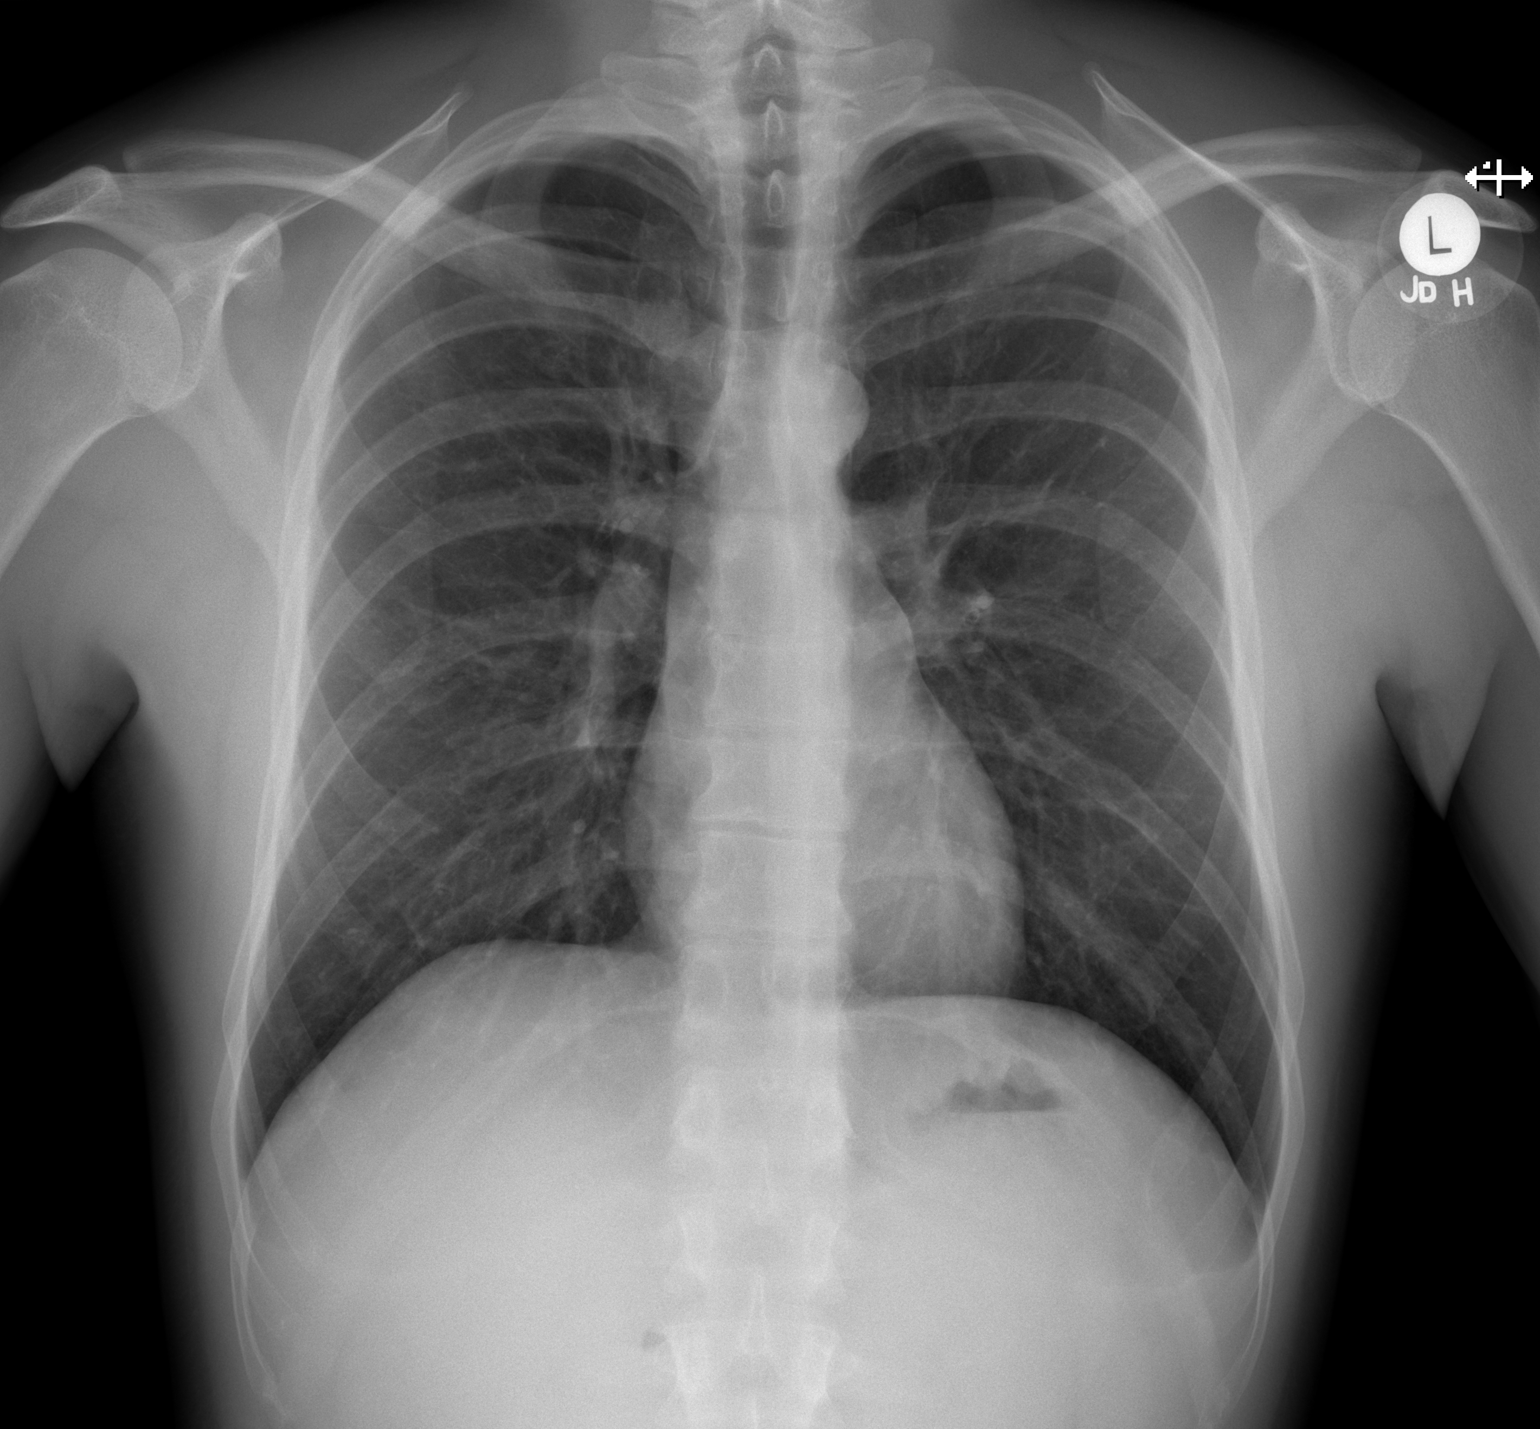

[w chest lat]
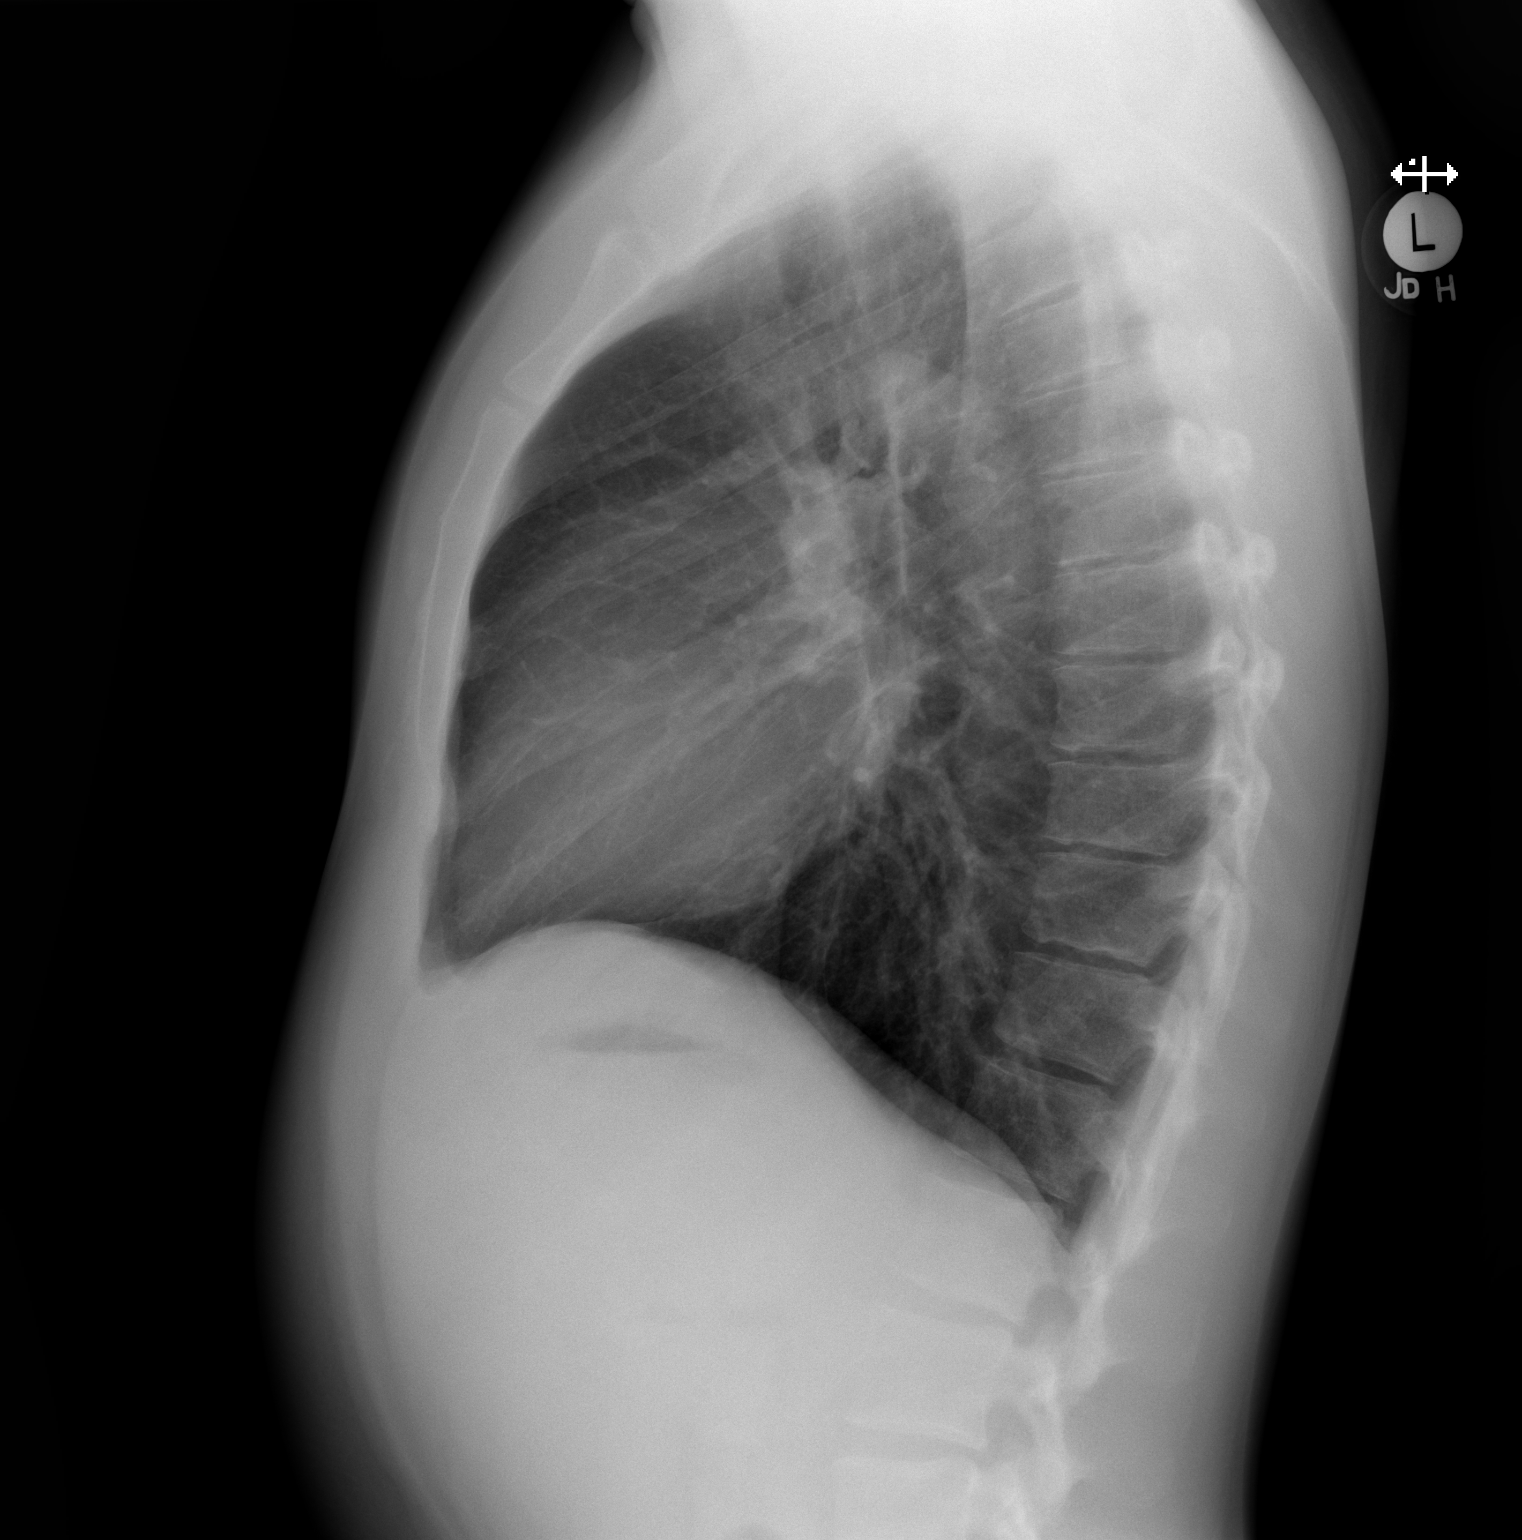

[2 of 2 positions shown; findings below may reference images not displayed]

FINDINGS: The cardiomediastinal silhouette is within normal limits. The lungs
are well inflated and clear. There is no evidence of pleural
effusion or pneumothorax. No acute osseous abnormality is
identified.
IMPRESSION: No active cardiopulmonary disease.

## 2018-01-09 ENCOUNTER — Emergency Department (HOSPITAL_COMMUNITY)
Admission: EM | Admit: 2018-01-09 | Discharge: 2018-01-10 | Disposition: A | Discharge status: Home / Self Care | Payer: Self-pay | Attending: Emergency Medicine | Admitting: Emergency Medicine

## 2018-01-09 ENCOUNTER — Encounter (HOSPITAL_COMMUNITY): Payer: Self-pay | Admitting: Emergency Medicine

## 2018-01-09 DIAGNOSIS — F17210 Nicotine dependence, cigarettes, uncomplicated: Secondary | ICD-10-CM | POA: Insufficient documentation

## 2018-01-09 DIAGNOSIS — J029 Acute pharyngitis, unspecified: Secondary | ICD-10-CM | POA: Insufficient documentation

## 2018-01-09 DIAGNOSIS — R6883 Chills (without fever): Secondary | ICD-10-CM | POA: Insufficient documentation

## 2018-01-09 DIAGNOSIS — M62830 Muscle spasm of back: Secondary | ICD-10-CM | POA: Insufficient documentation

## 2018-01-09 DIAGNOSIS — R05 Cough: Secondary | ICD-10-CM | POA: Insufficient documentation

## 2018-01-09 DIAGNOSIS — Y990 Civilian activity done for income or pay: Secondary | ICD-10-CM | POA: Insufficient documentation

## 2018-01-09 DIAGNOSIS — M79652 Pain in left thigh: Secondary | ICD-10-CM | POA: Insufficient documentation

## 2018-01-09 DIAGNOSIS — J3489 Other specified disorders of nose and nasal sinuses: Secondary | ICD-10-CM | POA: Insufficient documentation

## 2018-01-09 DIAGNOSIS — Y9389 Activity, other specified: Secondary | ICD-10-CM | POA: Insufficient documentation

## 2018-01-09 DIAGNOSIS — R059 Cough, unspecified: Secondary | ICD-10-CM

## 2018-01-09 DIAGNOSIS — R1110 Vomiting, unspecified: Secondary | ICD-10-CM | POA: Insufficient documentation

## 2018-01-09 DIAGNOSIS — X500XXA Overexertion from strenuous movement or load, initial encounter: Secondary | ICD-10-CM | POA: Insufficient documentation

## 2018-01-09 DIAGNOSIS — R0981 Nasal congestion: Secondary | ICD-10-CM | POA: Insufficient documentation

## 2018-01-09 DIAGNOSIS — M79651 Pain in right thigh: Secondary | ICD-10-CM | POA: Insufficient documentation

## 2018-01-09 DIAGNOSIS — Y9289 Other specified places as the place of occurrence of the external cause: Secondary | ICD-10-CM | POA: Insufficient documentation

## 2018-01-09 NOTE — ED Triage Notes (Signed)
Pt reports back pain that has gotten so bad he had to take off work.  Pain goes down his legs.  Also developed a cough.

## 2018-01-10 MED ORDER — IBUPROFEN 800 MG PO TABS
800.0000 mg | ORAL_TABLET | Freq: Once | ORAL | Status: AC
  Administered 2018-01-10: 800 mg via ORAL
  Filled 2018-01-10: qty 1

## 2018-01-10 MED ORDER — METHOCARBAMOL 500 MG PO TABS: 500.0000 mg | ORAL_TABLET | Freq: Two times a day (BID) | ORAL | 0 refills | Status: DC

## 2018-01-10 MED ORDER — BENZONATATE 100 MG PO CAPS: 100.0000 mg | ORAL_CAPSULE | Freq: Three times a day (TID) | ORAL | 0 refills | Status: DC

## 2018-01-10 MED ORDER — ONDANSETRON 4 MG PO TBDP
4.0000 mg | ORAL_TABLET | Freq: Once | ORAL | Status: AC
  Administered 2018-01-10: 4 mg via ORAL
  Filled 2018-01-10: qty 1

## 2018-01-10 MED ORDER — BENZONATATE 100 MG PO CAPS
100.0000 mg | ORAL_CAPSULE | Freq: Once | ORAL | Status: AC
  Administered 2018-01-10: 100 mg via ORAL
  Filled 2018-01-10: qty 1

## 2018-01-10 MED ORDER — ONDANSETRON 4 MG PO TBDP: 4.0000 mg | ORAL_TABLET | Freq: Three times a day (TID) | ORAL | 0 refills | Status: DC | PRN

## 2018-01-10 NOTE — ED Notes (Signed)
Pt stable, ambulatory, and verbalizes understanding of d/c instructions.  

## 2018-01-10 NOTE — Discharge Instructions (Signed)
Take the prescribed medication as directed.  Make sure to rest and drink fluids. Follow-up with your primary care doctor. Return to the ED for new or worsening symptoms. 

## 2018-01-10 NOTE — ED Provider Notes (Signed)
[facility] EMERGENCY DEPARTMENT Provider Note   CSN: [id number] Arrival date & time: 01/09/18  2242     History   Chief Complaint Chief Complaint  Patient presents with  . Back Pain  . Cough    HPI Daquawn Seelman is a 27 y.o. male.  The history is provided by the patient and medical records.     27 year old male with history of ADHD, cocaine abuse, presenting to the ED with multiple complaints.  Ports he works for [employer] and a few days ago he felt like he threw his back out while loading a truck.  States since then he has had a lot of soreness/stiffness in his entire back.  States he feels like he may have pulled something in his hamstrings as well.  He denies any numbness or weakness of the legs.  No bowel or bladder incontinence.  He did try taking his Flexeril that he had leftover from prior doctor's visit but states it just made him go to sleep.  He has not tried any other medications.  He has been using heat therapy as well.  Reports he also feels like he has come down with the flu.  He woke up yesterday afternoon with diffuse body aches, chills, cough, nasal congestion, sore throat, and runny nose.  He did vomit once at home earlier today but thinks it was from heavy coughing.  States he has been using cough drops without much relief.  States he does have multiple sick contacts on a regular basis.  Past Medical History:  Diagnosis Date  . ADHD (attention deficit hyperactivity disorder)   . Cocaine abuse (HCC)     There are no active problems to display for this patient.   History reviewed. No pertinent surgical history.      Home Medications    Prior to Admission medications   Not on File    Family History No family history on file.  Social History Social History   Tobacco Use  . Smoking status: Current Every Day Smoker    Packs/day: 1.00    Types: Cigarettes  . Smokeless tobacco: Never Used  Substance Use Topics  . Alcohol use: Yes   Comment: socially  . Drug use: Yes    Frequency: 1.0 times per week    Types: Marijuana    Comment: heroin     Allergies   Other   Review of Systems Review of Systems  Constitutional: Positive for chills.  HENT: Positive for congestion, postnasal drip and rhinorrhea.   Respiratory: Positive for cough.   Musculoskeletal: Positive for back pain and myalgias.  All other systems reviewed and are negative.    Physical Exam Updated Vital Signs BP (!) 150/97 (BP Location: Right Arm)   Pulse (!) 109   Temp 98.9 F (37.2 C) (Oral)   Resp 16   SpO2 99%   Physical Exam  Constitutional: He is oriented to person, place, and time. He appears well-developed and well-nourished.  HENT:  Head: Normocephalic and atraumatic.  Right Ear: Tympanic membrane and ear canal normal.  Left Ear: Tympanic membrane and ear canal normal.  Nose: Mucosal edema present.  Mouth/Throat: Uvula is midline, oropharynx is clear and moist and mucous membranes are normal. No oropharyngeal exudate, posterior oropharyngeal edema, posterior oropharyngeal erythema or tonsillar abscesses.  + nasal congestion  Eyes: Pupils are equal, round, and reactive to light. Conjunctivae and EOM are normal.  Neck: Normal range of motion.  Cardiovascular: Normal rate, regular rhythm and  normal heart sounds.  Pulmonary/Chest: Effort normal and breath sounds normal. No stridor. No respiratory distress. He has no wheezes. He has no rhonchi.  Abdominal: Soft. Bowel sounds are normal. There is no tenderness. There is no rebound.  Musculoskeletal: Normal range of motion.  Diffuse muscle soreness throughout the back, there is no midline step-off or deformity, normal strength and sensation of both legs, normal gait  Neurological: He is alert and oriented to person, place, and time.  Skin: Skin is warm and dry.  Psychiatric: He has a normal mood and affect.  Nursing note and vitals reviewed.    ED Treatments / Results  Labs (all  labs ordered are listed, but only abnormal results are displayed) Labs Reviewed - No data to display  EKG None  Radiology No results found.  Procedures Procedures (including critical care time)  Medications Ordered in ED Medications  benzonatate (TESSALON) capsule 100 mg (100 mg Oral Given 01/10/18 0044)  ibuprofen (ADVIL,MOTRIN) tablet 800 mg (800 mg Oral Given 01/10/18 0044)  ondansetron (ZOFRAN-ODT) disintegrating tablet 4 mg (4 mg Oral Given 01/10/18 0044)     Initial Impression / Assessment and Plan / ED Course  I have reviewed the triage vital signs and the nursing notes.  Pertinent labs & imaging results that were available during my care of the patient were reviewed by me and considered in my medical decision making (see chart for details).  27 year old male here with multiple concerns.  1.  Back pain--works for [employer] and reports he "threw out" his back a few days ago loading a truck.  Has diffuse muscle soreness of the back without any midline step-off or deformity.  Reports soreness in his hamstring but denies any numbness or weakness of the legs.  No bowel or bladder incontinence.  No focal deficits suggestive of cauda equina.  Will treat symptomatically with heat and muscle relaxers, continue anti-inflammatories.  2.  URI symptoms--began abruptly yesterday upon waking in the afternoon.  Reports nasal congestion, cough, body aches, and chills.  Did have emesis at home PTA today. Does report sick contacts.  Exam WNL aside from nasal congestion.  Lungs CTAB.  Suspect viral illness, possibly flu.  Discussed risk/benefit of tamiflu, patient opted against this and preferred symptomatic treatment.  Given Rx zofran, tessalon, motrin.  Encouraged rest and oral fluids.  Work note given.  Discussed plan with patient, he acknowledged understanding and agreed with plan of care.  Return precautions given for new or worsening symptoms.  Final Clinical Impressions(s) / ED Diagnoses   Final  diagnoses:  Cough  Muscle spasm of back    ED Discharge Orders         Ordered    benzonatate (TESSALON) 100 MG capsule  Every 8 hours     01/10/18 0042    ondansetron (ZOFRAN ODT) 4 MG disintegrating tablet  Every 8 hours PRN     01/10/18 0042    methocarbamol (ROBAXIN) 500 MG tablet  2 times daily     01/10/18 0042           Garlon HatchetSanders, Lateka Rady M, PA-C 01/10/18 0105    Gilda CreasePollina, Christopher J, MD 01/10/18 [phone]

## 2018-08-03 IMAGING — DX DG HAND COMPLETE 3+V*L*
3 series · 3 of 3 positions shown · non-contrast
Comparison: None.

CLINICAL DATA: Swelling after injecting heroin into the soft
tissues of the hand

EXAM:
LEFT HAND - COMPLETE 3+ VIEW

[x hand pa left]
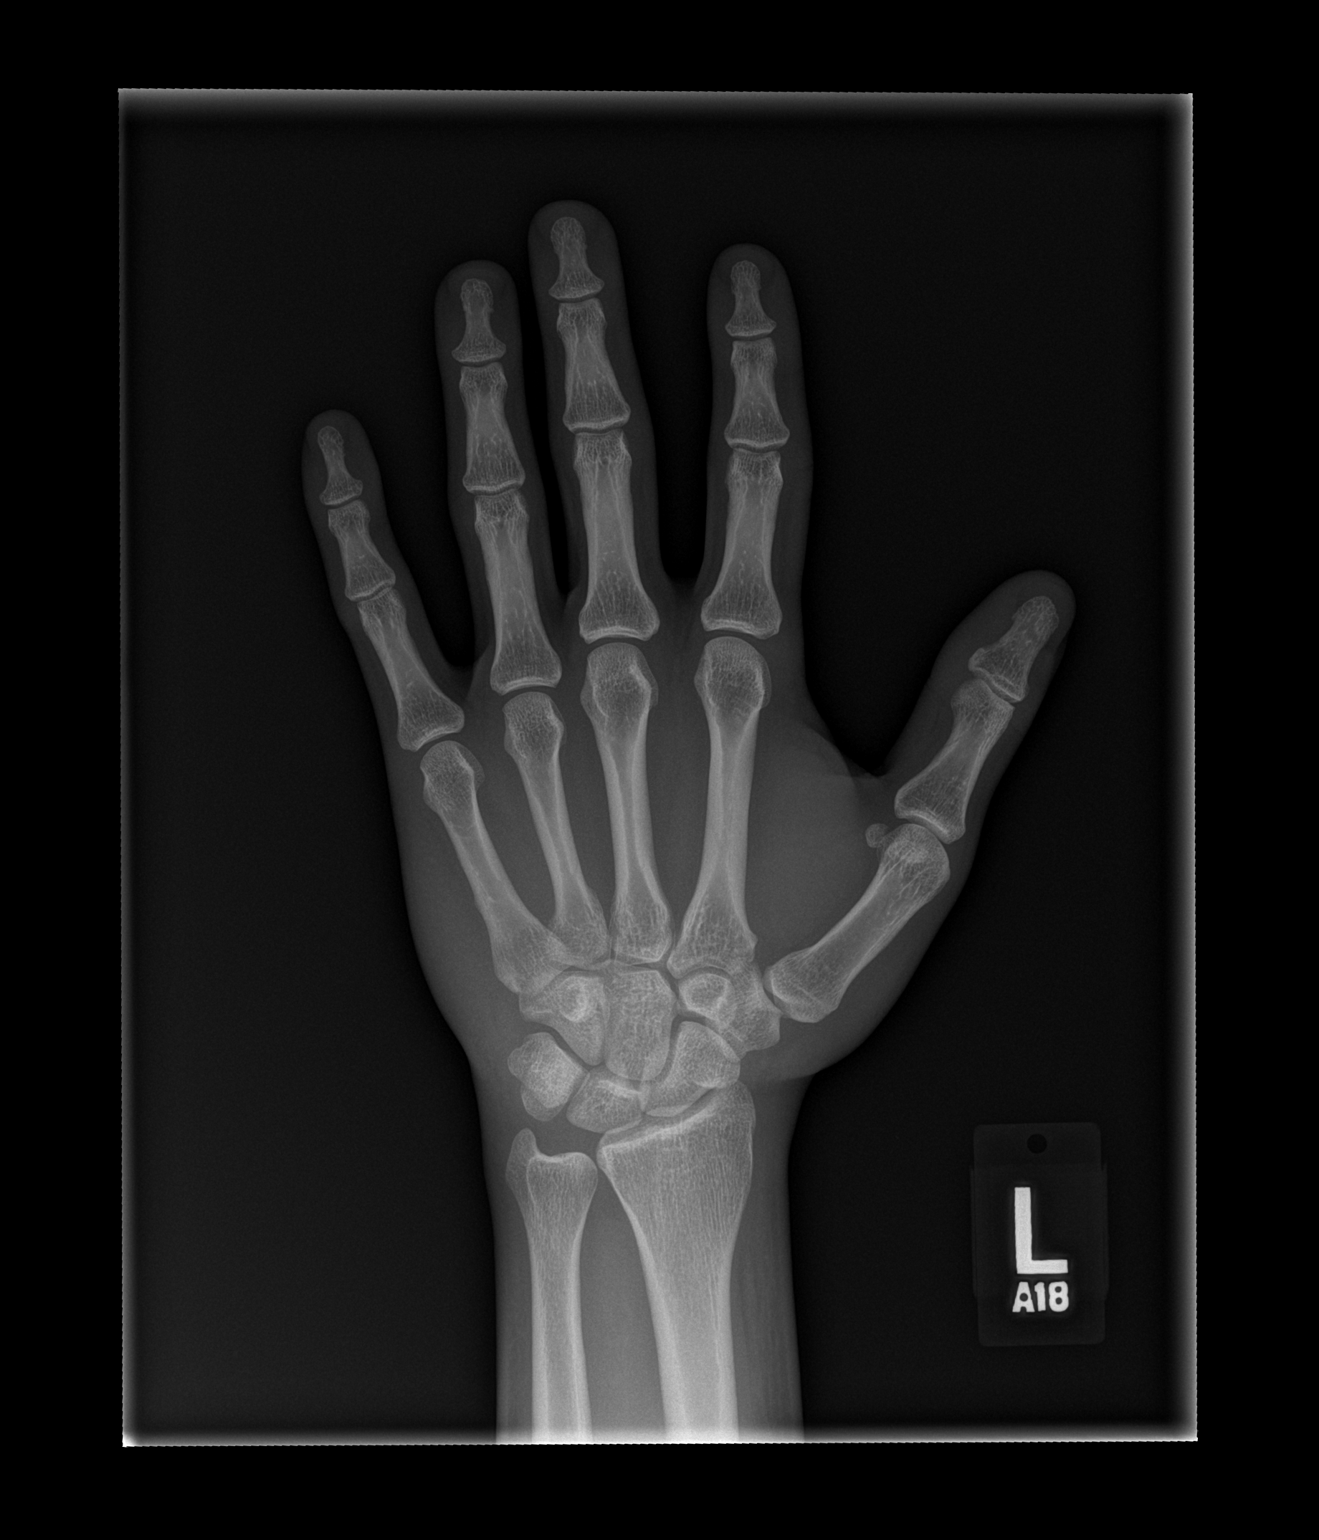

[x hand obl left]
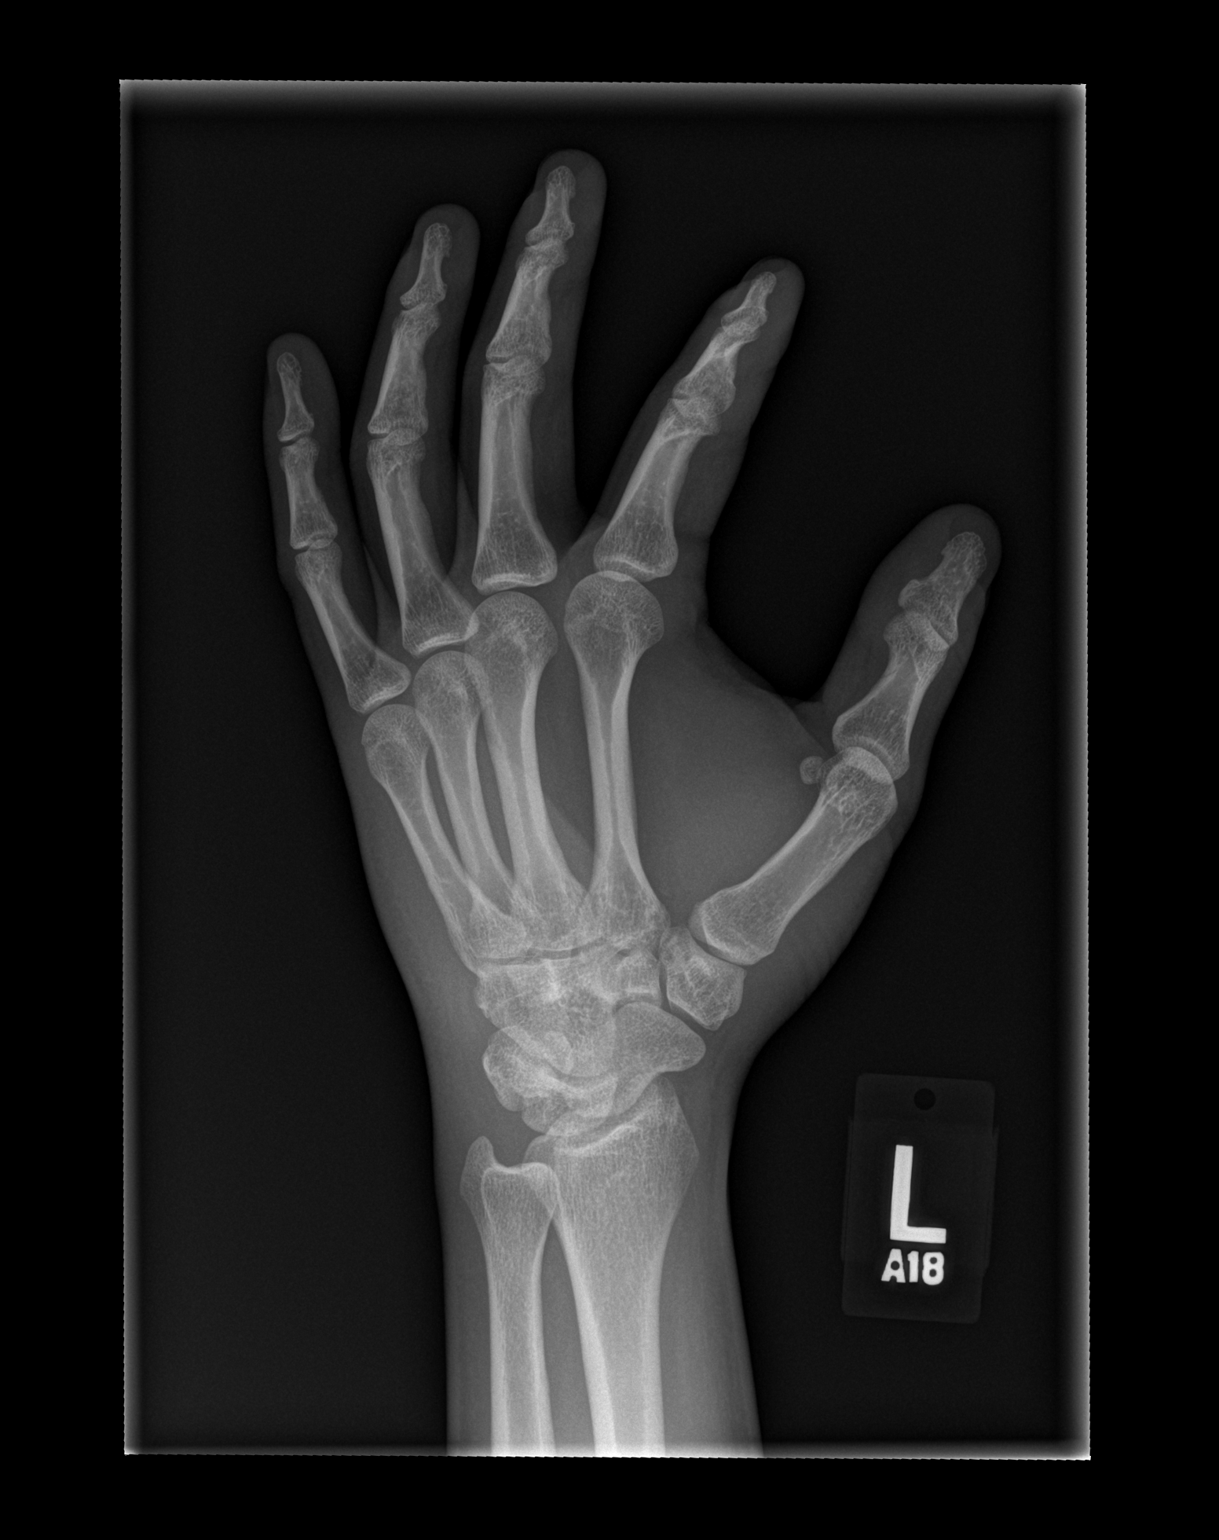

[x hand lat left]
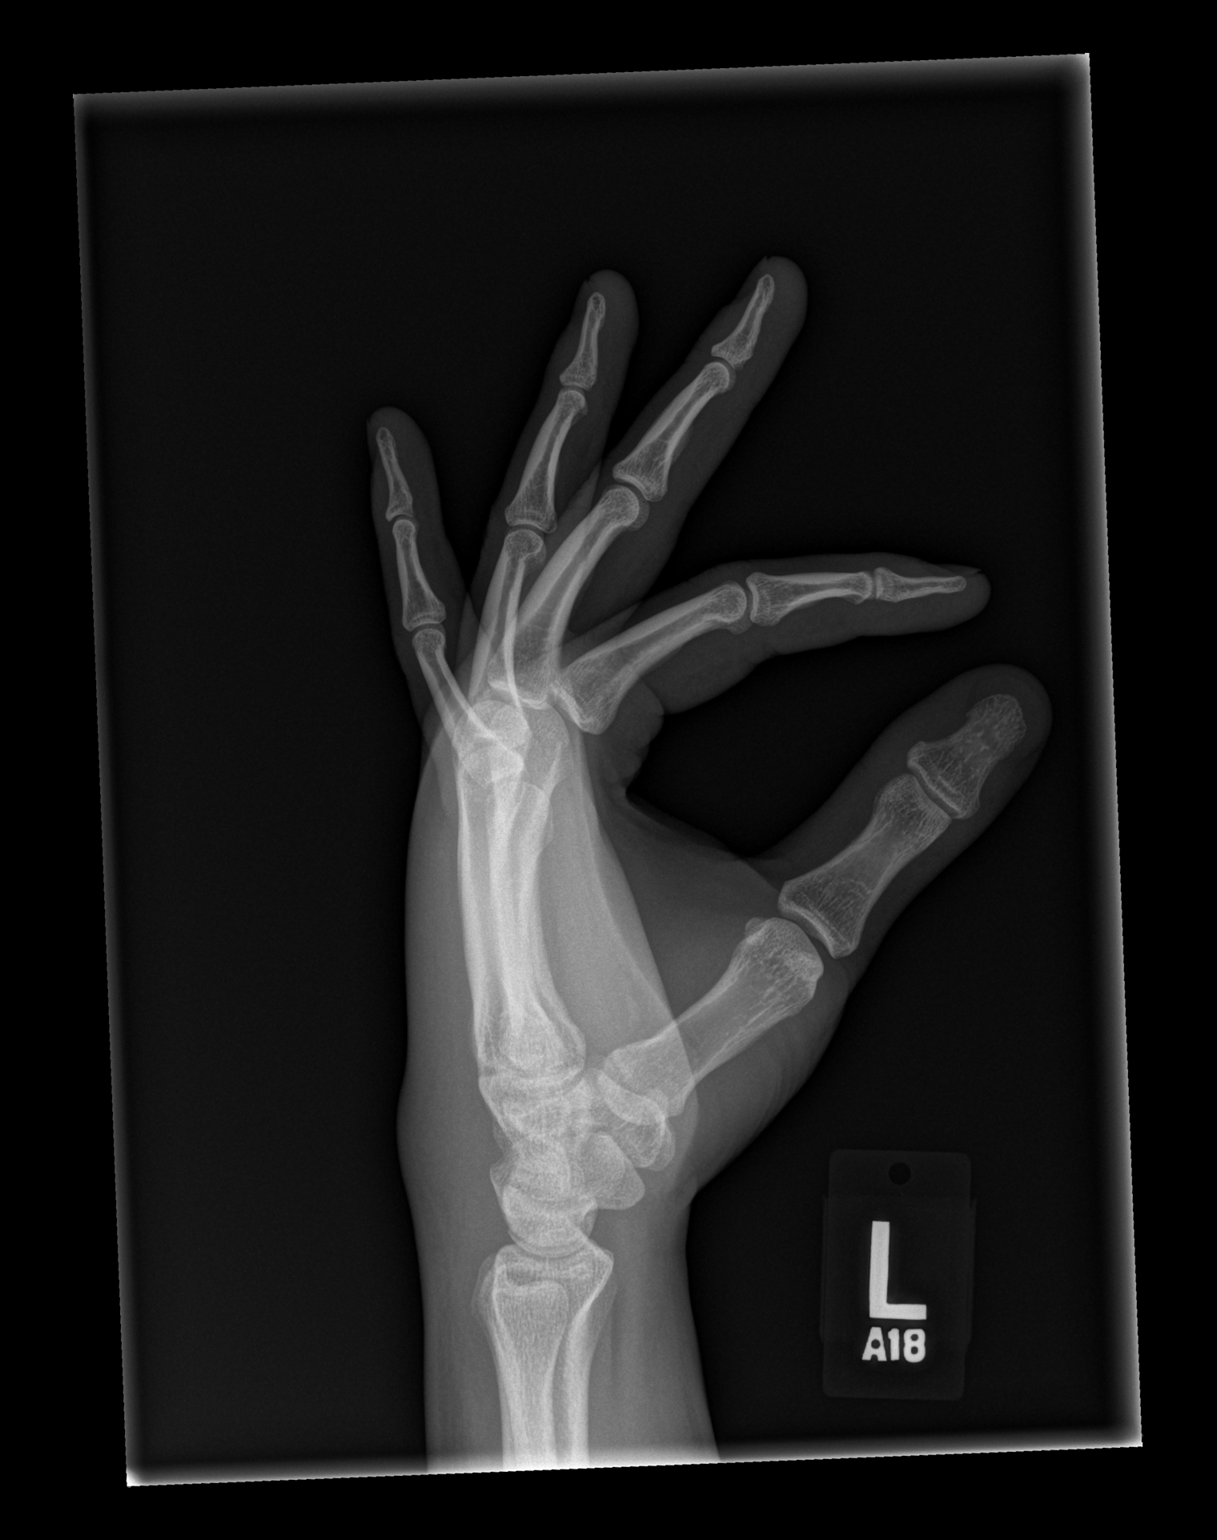

[3 of 3 positions shown; findings below may reference images not displayed]

FINDINGS: Frontal, oblique, and lateral views obtained. There is soft tissue
swelling along the dorsum of the hand. No fracture or dislocation.
Joint spaces appear normal. No erosive change or bony destruction.
No radiopaque foreign body.
IMPRESSION: Soft tissue swelling dorsally. No radiopaque foreign body. No bony
abnormality. In particular, no bony destruction or erosion. No
fracture or dislocation.

## 2018-08-24 ENCOUNTER — Other Ambulatory Visit: Payer: Self-pay

## 2018-08-24 ENCOUNTER — Emergency Department (HOSPITAL_COMMUNITY)
Admission: EM | Admit: 2018-08-24 | Discharge: 2018-08-24 | Disposition: A | Discharge status: Home / Self Care | Payer: Self-pay | Attending: Emergency Medicine | Admitting: Emergency Medicine

## 2018-08-24 ENCOUNTER — Encounter (HOSPITAL_COMMUNITY): Payer: Self-pay | Admitting: Emergency Medicine

## 2018-08-24 DIAGNOSIS — F1910 Other psychoactive substance abuse, uncomplicated: Secondary | ICD-10-CM | POA: Insufficient documentation

## 2018-08-24 DIAGNOSIS — F1290 Cannabis use, unspecified, uncomplicated: Secondary | ICD-10-CM | POA: Insufficient documentation

## 2018-08-24 DIAGNOSIS — R109 Unspecified abdominal pain: Secondary | ICD-10-CM

## 2018-08-24 DIAGNOSIS — R1031 Right lower quadrant pain: Secondary | ICD-10-CM | POA: Insufficient documentation

## 2018-08-24 DIAGNOSIS — L03114 Cellulitis of left upper limb: Secondary | ICD-10-CM | POA: Insufficient documentation

## 2018-08-24 DIAGNOSIS — F17210 Nicotine dependence, cigarettes, uncomplicated: Secondary | ICD-10-CM | POA: Insufficient documentation

## 2018-08-24 DIAGNOSIS — F909 Attention-deficit hyperactivity disorder, unspecified type: Secondary | ICD-10-CM | POA: Insufficient documentation

## 2018-08-24 HISTORY — DX: Other psychoactive substance abuse, uncomplicated: F19.10

## 2018-08-24 MED ORDER — DOXYCYCLINE HYCLATE 100 MG PO CAPS: 100.0000 mg | ORAL_CAPSULE | Freq: Two times a day (BID) | ORAL | 0 refills | Status: DC
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
## 2018-08-24 NOTE — Discharge Instructions (Signed)
Take Doxycycline twice a day for one week Please return if worsening
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
## 2021-03-23 ENCOUNTER — Emergency Department (HOSPITAL_COMMUNITY)
Admission: EM | Admit: 2021-03-23 | Discharge: 2021-03-24 | Disposition: A | Discharge status: Home / Self Care | Payer: No Typology Code available for payment source | Attending: Emergency Medicine | Admitting: Emergency Medicine

## 2021-03-23 ENCOUNTER — Encounter (HOSPITAL_COMMUNITY): Payer: Self-pay | Admitting: Emergency Medicine

## 2021-03-23 ENCOUNTER — Other Ambulatory Visit: Payer: Self-pay

## 2021-03-23 DIAGNOSIS — W260XXA Contact with knife, initial encounter: Secondary | ICD-10-CM | POA: Insufficient documentation

## 2021-03-23 DIAGNOSIS — Z23 Encounter for immunization: Secondary | ICD-10-CM | POA: Insufficient documentation

## 2021-03-23 DIAGNOSIS — S61112A Laceration without foreign body of left thumb with damage to nail, initial encounter: Secondary | ICD-10-CM | POA: Insufficient documentation

## 2021-03-23 DIAGNOSIS — S60932A Unspecified superficial injury of left thumb, initial encounter: Secondary | ICD-10-CM | POA: Diagnosis present

## 2021-03-23 DIAGNOSIS — Y990 Civilian activity done for income or pay: Secondary | ICD-10-CM | POA: Diagnosis not present

## 2021-03-23 MED ORDER — TETANUS-DIPHTH-ACELL PERTUSSIS 5-2.5-18.5 LF-MCG/0.5 IM SUSY
0.5000 mL | PREFILLED_SYRINGE | Freq: Once | INTRAMUSCULAR | Status: AC
  Administered 2021-03-24: 0.5 mL via INTRAMUSCULAR
  Filled 2021-03-23: qty 0.5

## 2021-03-23 NOTE — ED Triage Notes (Signed)
Pt here with laceration to left thumb from a knife. Bleeding controlled at this time, unknown last tetanus.

## 2021-03-23 NOTE — ED Provider Triage Note (Signed)
Emergency Medicine Provider Triage Evaluation Note  Gregg Holmes , a 31 y.o. male  was evaluated in triage.  Pt complains of laceration to the left thumb.  Happened while cutting food with a knife, unsure when his last tetanus was.  It does involve the nailbed.  Review of Systems  Positive: laceration Negative:   Physical Exam  BP (!) 163/124 (BP Location: Left Arm)    Pulse (!) 118    Temp 98.3 F (36.8 C)    Resp 20    SpO2 96%  Gen:   Awake, no distress   Resp:  Normal effort  MSK:   Moves extremities without difficulty  Other:  Roughly 2.5 to 3 cm laceration to the left thumb.  Its involves the nailbed, circumferential with bleeding controlled with direct pressure.  Cap refill intact and less than 2, radial pulse 2+.  Medical Decision Making  Medically screening exam initiated at 8:24 PM.  Appropriate orders placed.  Gregg Holmes was informed that the remainder of the evaluation will be completed by another provider, this initial triage assessment does not replace that evaluation, and the importance of remaining in the ED until their evaluation is complete.  Chart review, patient does have a history of IV drug use.  I did not ask about this while in the room.  He may need antibiotic coverage, tetanus needs updating.  Do not feel he needs x-ray given superficial    Theron Arista, PA-C 03/23/21 2026

## 2021-03-24 NOTE — Discharge Instructions (Signed)
Take tylenol or motrin as needed for pain. Return to the ED for new or worsening symptoms.

## 2021-03-24 NOTE — ED Notes (Signed)
Provided provider with derma bond at this time

## 2021-03-24 NOTE — ED Provider Notes (Addendum)
[facility] Provider Note   CSN: [id number] Arrival date & time: 03/23/21  2002     History  Chief Complaint  Patient presents with   Laceration    [facility] is a 31 y.o. male.  The history is provided by the patient and medical records.  Laceration  31 y.o. M here with laceration to left thumb while chopping basil.  Caught the lateral edge of his left thumb nail.  Bleeding controlled on arrival.  Last tetanus unknown.  No meds PTA.  Home Medications Prior to Admission medications   Medication Sig Start Date End Date Taking? Authorizing Provider  benzonatate (TESSALON) 100 MG capsule Take 1 capsule (100 mg total) by mouth every 8 (eight) hours. 01/10/18   Garlon Hatchet, PA-C  doxycycline (VIBRAMYCIN) 100 MG capsule Take 1 capsule (100 mg total) by mouth 2 (two) times daily. 08/24/18   Bethel Born, PA-C  methocarbamol (ROBAXIN) 500 MG tablet Take 1 tablet (500 mg total) by mouth 2 (two) times daily. 01/10/18   Garlon Hatchet, PA-C  ondansetron (ZOFRAN ODT) 4 MG disintegrating tablet Take 1 tablet (4 mg total) by mouth every 8 (eight) hours as needed for nausea. 01/10/18   Garlon Hatchet, PA-C      Allergies    Other    Review of Systems   Review of Systems  Skin:  Positive for wound.  All other systems reviewed and are negative.  Physical Exam Updated Vital Signs BP (!) 184/124    Pulse (!) 125    Temp 98.3 F (36.8 C)    Resp 20    SpO2 100%   Physical Exam Vitals and nursing note reviewed.  Constitutional:      Appearance: He is well-developed.  HENT:     Head: Normocephalic and atraumatic.  Eyes:     Conjunctiva/sclera: Conjunctivae normal.     Pupils: Pupils are equal, round, and reactive to light.  Cardiovascular:     Rate and Rhythm: Normal rate and regular rhythm.     Heart sounds: Normal heart sounds.  Pulmonary:     Effort: Pulmonary effort is normal.     Breath sounds: Normal breath sounds.  Abdominal:      General: Bowel sounds are normal.     Palpations: Abdomen is soft.  Musculoskeletal:        General: Normal range of motion.     Cervical back: Normal range of motion.     Comments: Left thumb with very superficial 1cm laceration that involved the medial portion of the nail but this remains adhered and intact without bleeding; there is no circumferential wound, no active bleeding, able to flex/extend  Skin:    General: Skin is warm and dry.  Neurological:     Mental Status: He is alert and oriented to person, place, and time.    ED Results / Procedures / Treatments   Labs (all labs ordered are listed, but only abnormal results are displayed) Labs Reviewed - No data to display  EKG None  Radiology No results found.  Procedures Procedures   LACERATION REPAIR Performed by: Garlon Hatchet Authorized by: Garlon Hatchet Consent: Verbal consent obtained. Risks and benefits: risks, benefits and alternatives were discussed Consent given by: patient Patient identity confirmed: provided demographic data Prepped and Draped in normal sterile fashion Wound explored  Laceration Location: left distal thumb  Laceration Length: 1cm, superficial   No Foreign Bodies seen or palpated  Anesthesia:  none  Local anesthetic: none  Anesthetic total: 0 ml  Irrigation method: syringe Amount of cleaning: standard  Skin closure: dermabond  Number of sutures: 0  Technique: n/a  Patient tolerance: Patient tolerated the procedure well with no immediate complications.    Medications Ordered in ED Medications  Tdap (BOOSTRIX) injection 0.5 mL (has no administration in time range)    ED Course/ Medical Decision Making/ A&P                           Medical Decision Making   31 y.o. M here with laceration to left thumb from a knife while chopping basil.  On exam, left thumb with very superficial wound involving the lateral margin of the nail but this remains intact and strongly  adhered to the nailbed.  Contrary to triage note, there is no underlying nailbed injury and wound is not circumferential.  It is maximum 1 cm in length.  Wound is hemostatic.  Wound was cleansed, repaired easily with Dermabond.  Tetanus has been updated.  Stable for discharge.  Final Clinical Impression(s) / ED Diagnoses Final diagnoses:  Laceration of left thumb without foreign body with damage to nail, initial encounter    Rx / DC Orders ED Discharge Orders     None         Garlon Hatchet, PA-C 03/24/21 0214    Dione Booze, MD 03/24/21 0641    Garlon Hatchet, PA-C 03/24/21 2203    Dione Booze, MD 03/24/21 2244

## 2021-03-24 NOTE — ED Notes (Signed)
Provider at bedside at this time

## 2021-06-06 ENCOUNTER — Encounter (HOSPITAL_COMMUNITY): Payer: Self-pay | Admitting: *Deleted

## 2021-06-06 ENCOUNTER — Ambulatory Visit (HOSPITAL_COMMUNITY)
Admission: EM | Admit: 2021-06-06 | Discharge: 2021-06-06 | Disposition: A | Discharge status: Home / Self Care | Payer: Managed Care, Other (non HMO) | Attending: Family Medicine | Admitting: Family Medicine

## 2021-06-06 DIAGNOSIS — J069 Acute upper respiratory infection, unspecified: Secondary | ICD-10-CM | POA: Diagnosis not present

## 2021-06-06 DIAGNOSIS — R52 Pain, unspecified: Secondary | ICD-10-CM

## 2021-06-06 LAB — POC INFLUENZA A AND B ANTIGEN (URGENT CARE ONLY)
INFLUENZA A ANTIGEN, POC: NEGATIVE
INFLUENZA B ANTIGEN, POC: NEGATIVE

## 2021-06-06 MED ORDER — PROMETHAZINE-DM 6.25-15 MG/5ML PO SYRP: 5.0000 mL | ORAL_SOLUTION | Freq: Four times a day (QID) | ORAL | 0 refills | Status: DC | PRN

## 2021-06-06 MED ORDER — ACETAMINOPHEN 325 MG PO TABS
650.0000 mg | ORAL_TABLET | Freq: Once | ORAL | Status: AC
  Administered 2021-06-06: 650 mg via ORAL

## 2021-06-06 MED ORDER — ALBUTEROL SULFATE HFA 108 (90 BASE) MCG/ACT IN AERS: 2.0000 | INHALATION_SPRAY | RESPIRATORY_TRACT | 0 refills | Status: DC | PRN

## 2021-06-06 MED ORDER — ACETAMINOPHEN 325 MG PO TABS
ORAL_TABLET | ORAL | Status: AC
  Filled 2021-06-06: qty 2

## 2021-06-06 NOTE — ED Triage Notes (Signed)
C/O Body aches, cough, chills, congestion, HA onset yesterday with worsening today. Unsure if fevers. States had positive Covid 2 wks ago. ?

## 2021-06-06 NOTE — Discharge Instructions (Addendum)
You were seen today for upper respiratory symptoms and body aches.  ?Your flu swab was negative today.  ?Your symptoms are likely viral in nature.  ?I have sent out a medication to help with the cough and an inhaler to help with wheezing and shortness of breath.  ?I recommend getting rest and fluids, as well as taking tylenol/motrin to help with body aches.  ?Please follow up if not improving.  ?

## 2021-06-06 NOTE — ED Provider Notes (Signed)
?[facility] ? ? ? ?CSN: [id number] ?Arrival date & time: 06/06/21  1556 ? ? ?  ? ?History   ?Chief Complaint ?Chief Complaint  ?Patient presents with  ? Cough  ? Nasal Congestion  ? ? ?HPI ?Emilio Mitchelle is a 31 y.o. male.  ? ?Patient is here for uri symptoms since yesterday.  ?Cough, congestion, body aches, headache, feels weak.  ?No known fevers.  ?He did have covid 2 weeks.  ?He is having sob at this time.  ?He did take mucinex and excedrin for his symptoms.  ?No known sick contacts.  ?+ post tussive emesis.  No n/v.  Hurts to swallow.  ?He tried to work but was unable to today.  ? ?Past Medical History:  ?Diagnosis Date  ? ADHD (attention deficit hyperactivity disorder)   ? Cocaine abuse ([place])   ? IV drug abuse ([place])   ? ? ?There are no problems to display for this patient. ? ? ?History reviewed. No pertinent surgical history. ? ? ? ? ?Home Medications   ? ?Prior to Admission medications   ?Medication Sig Start Date End Date Taking? Authorizing Provider  ?buPROPion HCl (WELLBUTRIN PO) Take by mouth.   Yes [provider]  ?Pantoprazole Sodium (PROTONIX PO) Take by mouth.   Yes [provider]  ?QUEtiapine Fumarate (SEROQUEL PO) Take by mouth.   Yes [provider]  ?benzonatate (TESSALON) 100 MG capsule Take 1 capsule (100 mg total) by mouth every 8 (eight) hours. 01/10/18   Larene Pickett, PA-C  ?doxycycline (VIBRAMYCIN) 100 MG capsule Take 1 capsule (100 mg total) by mouth 2 (two) times daily. 08/24/18   Recardo Evangelist, PA-C  ?methocarbamol (ROBAXIN) 500 MG tablet Take 1 tablet (500 mg total) by mouth 2 (two) times daily. 01/10/18   Larene Pickett, PA-C  ?ondansetron (ZOFRAN ODT) 4 MG disintegrating tablet Take 1 tablet (4 mg total) by mouth every 8 (eight) hours as needed for nausea. 01/10/18   Larene Pickett, PA-C  ? ? ?Family History ?Family History  ?Problem Relation Age of Onset  ? Healthy Mother   ? Healthy Father   ? ? ?Social History ?Social History  ? ?Tobacco Use   ? Smoking status: Never  ? Smokeless tobacco: Never  ?Vaping Use  ? Vaping Use: Every day  ?Substance Use Topics  ? Alcohol use: Yes  ?  Comment: socially  ? Drug use: Yes  ?  Frequency: 1.0 times per week  ?  Types: Marijuana  ?  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 ? ? ?Allergies   ?Other ? ? ?Review of Systems ?Review of Systems  ?Constitutional:  Positive for chills and fatigue. Negative for fever.  ?HENT:  Positive for congestion, rhinorrhea and sore throat.   ?Respiratory:  Positive for cough, shortness of breath and wheezing.   ?Cardiovascular: Negative.   ?Gastrointestinal: Negative.   ?Genitourinary: Negative.   ?Musculoskeletal:  Positive for arthralgias.  ? ? ?Physical Exam ?Triage Vital Signs ?ED Triage Vitals  ?Enc Vitals Group  ?   BP 06/06/21 1639 (!) 150/95  ?   Pulse Rate 06/06/21 1639 (!) 119  ?   Resp 06/06/21 1639 18  ?   Temp 06/06/21 1639 99.2 ?F (37.3 ?C)  ?   Temp Source 06/06/21 1639 Oral  ?   SpO2 06/06/21 1639 96 %  ?   Weight --   ?   Height --   ?   Head  Circumference --   ?   Peak Flow --   ?   Pain Score 06/06/21 1641 7  ?   Pain Loc --   ?   Pain Edu? --   ?   Excl. in [place]? --   ? ?No data found. ? ?Updated Vital Signs ?BP (!) 150/95   Pulse (!) 119   Temp 99.2 ?F (37.3 ?C) (Oral)   Resp 18   SpO2 96%  ? ?Visual Acuity ?Right Eye Distance:   ?Left Eye Distance:   ?Bilateral Distance:   ? ?Right Eye Near:   ?Left Eye Near:    ?Bilateral Near:    ? ?Physical Exam ?Constitutional:   ?   Appearance: Normal appearance.  ?HENT:  ?   Head: Normocephalic and atraumatic.  ?   Right Ear: Tympanic membrane normal.  ?   Left Ear: Tympanic membrane normal.  ?   Nose: Congestion and rhinorrhea present.  ?   Mouth/Throat:  ?   Mouth: Mucous membranes are moist.  ?   Pharynx: No oropharyngeal exudate or posterior oropharyngeal erythema.  ?Cardiovascular:  ?   Rate and Rhythm: Normal rate and regular rhythm.  ?Pulmonary:  ?   Effort: Pulmonary effort is normal.  ?   Breath sounds:  Normal breath sounds.  ?Musculoskeletal:  ?   Cervical back: Normal range of motion and neck supple. No tenderness.  ?Skin: ?   General: Skin is warm.  ?Neurological:  ?   General: No focal deficit present.  ?   Mental Status: He is alert.  ?Psychiatric:     ?   Mood and Affect: Mood normal.  ? ? ? ?UC Treatments / Results  ?Labs ?(all labs ordered are listed, but only abnormal results are displayed) ?Labs Reviewed  ?POC INFLUENZA A AND B ANTIGEN (URGENT CARE ONLY)  ? ? ?EKG ? ? ?Radiology ?No results found. ? ?Procedures ?Procedures (including critical care time) ? ?Medications Ordered in UC ?Medications  ?acetaminophen (TYLENOL) tablet 650 mg (650 mg Oral Given 06/06/21 1647)  ? ? ?Initial Impression / Assessment and Plan / UC Course  ?I have reviewed the triage vital signs and the nursing notes. ? ?Pertinent labs & imaging results that were available during my care of the patient were reviewed by me and considered in my medical decision making (see chart for details). ? ?  ?Final Clinical Impressions(s) / UC Diagnoses  ? ?Final diagnoses:  ?Upper respiratory tract infection, unspecified type  ?Body aches  ? ? ? ?Discharge Instructions   ? ?  ?You were seen today for upper respiratory symptoms and body aches.  ?Your flu swab was negative today.  ?Your symptoms are likely viral in nature.  ?I have sent out a medication to help with the cough and an inhaler to help with wheezing and shortness of breath.  ?I recommend getting rest and fluids, as well as taking tylenol/motrin to help with body aches.  ?Please follow up if not improving.  ? ? ? ?ED Prescriptions   ? ? Medication Sig Dispense Auth. Provider  ? albuterol (VENTOLIN HFA) 108 (90 Base) MCG/ACT inhaler Inhale 2 puffs into the lungs every 4 (four) hours as needed for wheezing or shortness of breath. 1 each Rondel Oh, MD  ? promethazine-dextromethorphan (PROMETHAZINE-DM) 6.25-15 MG/5ML syrup Take 5 mLs by mouth 4 (four) times daily as needed for cough. 118  mL Rondel Oh, MD  ? ?  ? ?PDMP not reviewed this encounter. ?  ?Eldin Bonsell, [place],  MD ?06/06/21 1741 ? ?

## 2022-10-11 ENCOUNTER — Encounter: Payer: Self-pay | Admitting: Emergency Medicine

## 2022-10-11 ENCOUNTER — Ambulatory Visit: Payer: Commercial Managed Care - HMO

## 2022-10-11 ENCOUNTER — Other Ambulatory Visit: Payer: Self-pay

## 2022-10-11 ENCOUNTER — Ambulatory Visit
Admission: EM | Admit: 2022-10-11 | Discharge: 2022-10-11 | Disposition: A | Discharge status: Home / Self Care | Payer: Commercial Managed Care - HMO | Attending: Nurse Practitioner | Admitting: Nurse Practitioner

## 2022-10-11 DIAGNOSIS — S62344A Nondisplaced fracture of base of fourth metacarpal bone, right hand, initial encounter for closed fracture: Secondary | ICD-10-CM | POA: Diagnosis not present

## 2022-10-11 DIAGNOSIS — M79641 Pain in right hand: Secondary | ICD-10-CM

## 2022-10-11 NOTE — Discharge Instructions (Signed)
The bottom of your fourth hand bone appears to be broken.  Will contact you with the formal read from the radiologist shows anything else on the x-ray.  We have put you in a soft splint today, please wear this until he follow-up with a hand specialist.  Keep your arm elevated and apply ice 15 minutes on, 45 minutes off every hour while awake as needed for pain or swelling.  You can take Tylenol or ibuprofen as needed for pain.

## 2022-10-11 NOTE — ED Provider Notes (Signed)
[facility]    CSN: [id number] Arrival date & time: 10/11/22  1412      History   Chief Complaint Chief Complaint  Patient presents with   Hand Pain    HPI Ladonta Colin is a 32 y.o. male.   Patient presents today for right hand pain after striking a wall 3 days ago with his hand.  Reports the hand is painful and swollen near the fourth and fifth digits.  Is also painful when he moves the fourth digit.  No numbness or tingling in the hand.  Has not take anything for pain, has been "talking through it."    Past Medical History:  Diagnosis Date   ADHD (attention deficit hyperactivity disorder)    Cocaine abuse ([facility])    IV drug abuse ([facility])     There are no problems to display for this patient.   History reviewed. No pertinent surgical history.     Home Medications    Prior to Admission medications   Medication Sig Start Date End Date Taking? Authorizing Provider  albuterol (VENTOLIN HFA) 108 (90 Base) MCG/ACT inhaler Inhale 2 puffs into the lungs every 4 (four) hours as needed for wheezing or shortness of breath. 06/06/21   Piontek, Denny Peon, MD  buPROPion HCl (WELLBUTRIN PO) Take by mouth.    [provider]  Pantoprazole Sodium (PROTONIX PO) Take by mouth.    [provider]  promethazine-dextromethorphan (PROMETHAZINE-DM) 6.25-15 MG/5ML syrup Take 5 mLs by mouth 4 (four) times daily as needed for cough. 06/06/21   Piontek, Denny Peon, MD  QUEtiapine Fumarate (SEROQUEL PO) Take by mouth.    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  Updated Vital Signs BP (!) 157/107 (BP Location: Left Arm)   Pulse 99   Temp 98.1 F (36.7 C) (Oral)   Resp 18   SpO2 97%   Visual Acuity Right Eye Distance:   Left Eye Distance:   Bilateral Distance:    Right Eye Near:   Left Eye Near:    Bilateral Near:  Physical Exam Vitals and nursing note reviewed.  Constitutional:      General: He is not in acute distress.    Appearance: Normal appearance. He is not toxic-appearing.  HENT:     Mouth/Throat:     Mouth: Mucous membranes are moist.     Pharynx: Oropharynx is clear.  Pulmonary:     Effort: Pulmonary effort is normal. No respiratory distress.  Musculoskeletal:     Comments: Inspection: Mild swelling and bruising noted to the proximal 3rd through 5th metacarpals; no obvious deformity, redness.  There are scabbed over abrasions noted to the third, fourth, and fifth proximal phalanxes Palpation: tender to palpation at the proximal fourth and fifth metacarpals; no obvious deformities palpated ROM: Full ROM to right hand Strength: 5/5 bilateral upper extremities Neurovascular: neurovascularly intact in bilateral upper extremities and distal to the injury  Skin:    General: Skin is warm and dry.     Capillary Refill: Capillary refill takes less than 2 seconds.     Coloration: Skin is not jaundiced or pale.     Findings: No erythema.  Neurological:     Mental Status: He is alert and oriented to person, place, and  time.  Psychiatric:        Behavior: Behavior is cooperative.      UC Treatments / Results  Labs (all labs ordered are listed, but only abnormal results are displayed) Labs Reviewed - No data to display  EKG   Radiology No results found.  Procedures Procedures (including critical care time)  Medications Ordered in UC Medications - No data to display  Initial Impression / Assessment and Plan / UC Course  I have reviewed the triage vital signs and the nursing notes.  Pertinent labs & imaging results that were available during my care of the patient were reviewed by me and considered in my medical decision making (see chart for details).   Patient is well-appearing, afebrile, not tachycardic, not tachypneic, oxygenating well on room air.  Patient is mildly hypertensive in urgent care today.  1. Closed nondisplaced fracture of base of fourth metacarpal bone of right hand, initial encounter Discussed with patient that I reviewed x-rays personally and had Dr. Tracie Harrier also review x-rays There appears to be a nondisplaced fracture of the base of the fourth metacarpal Patient was placed in an ulnar gutter splint and given information for follow-up with hand specialist Supportive care discussed with patient Discussed that we would call patient if the x-ray shows any other abnormalities  The patient was given the opportunity to ask questions.  All questions answered to their satisfaction.  The patient is in agreement to this plan.    Final Clinical Impressions(s) / UC Diagnoses   Final diagnoses:  Closed nondisplaced fracture of base of fourth metacarpal bone of right hand, initial encounter     Discharge Instructions      The bottom of your fourth hand bone appears to be broken.  Will contact you with the formal read from the radiologist shows anything else on the x-ray.  We have put you in a soft splint today, please wear this until he follow-up with a hand specialist.  Keep  your arm elevated and apply ice 15 minutes on, 45 minutes off every hour while awake as needed for pain or swelling.  You can take Tylenol or ibuprofen as needed for pain.    ED Prescriptions   None    PDMP not reviewed this encounter.   Valentino Nose,  NP 10/11/22 1558

## 2022-10-11 NOTE — ED Triage Notes (Signed)
Pt here for right hand pain after punching a wall 3 days ago; abrasions noted to knuckles

## 2023-02-14 ENCOUNTER — Ambulatory Visit (INDEPENDENT_AMBULATORY_CARE_PROVIDER_SITE_OTHER): Payer: Commercial Managed Care - HMO

## 2023-02-14 ENCOUNTER — Ambulatory Visit
Admission: EM | Admit: 2023-02-14 | Discharge: 2023-02-14 | Disposition: A | Discharge status: Home / Self Care | Payer: Commercial Managed Care - HMO | Attending: Family Medicine | Admitting: Family Medicine

## 2023-02-14 DIAGNOSIS — S6991XA Unspecified injury of right wrist, hand and finger(s), initial encounter: Secondary | ICD-10-CM

## 2023-02-14 NOTE — ED Triage Notes (Signed)
 Patient presents with right hand pain, states he fell on 02/10/23 and is unable to lift his ring finger. Patient states he broke it in 2024.

## 2023-02-14 NOTE — Discharge Instructions (Signed)
 Come in wearing the hand splint that you have at home while working from any duties around the house to prevent reinjury to your hand.  Follow-up with a hand specialist that you saw previously according to imaging the fracture you sustained back in September has not completely healed and you need further evaluation and follow-up to determine course of action and any complications involving your hand. May take ibuprofen  or Aleve  as needed for pain.

## 2023-02-14 NOTE — ED Provider Notes (Signed)
 [facility]    CSN: [id number] Arrival date & time: 02/14/23  1430      History   Chief Complaint Chief Complaint  Patient presents with   Hand Pain    Right     HPI Gregg Holmes is a 33 y.o. male.   HPI Patient with a history of a recent fracture involving base of the fourth metacarpal bone on the right hand presents today with a reinjury.  Patient reports he fell 4 days ago landing directly on an outstretched hand.  His right hand has swollen and he is having significant pain with extending his fourth finger.  He has not taken any medication.  He reports that he did follow-up with an orthopedic hand specialist after his initial injury.  He reports the splint was removed and he was placed in a spica thumb splint.  He reports he has not had any additional follow-up with the hand specialist since injury occurred back in September. Past Medical History:  Diagnosis Date   ADHD (attention deficit hyperactivity disorder)    Cocaine abuse (HCC)    IV drug abuse (HCC)     There are no active problems to display for this patient.   History reviewed. No pertinent surgical history.     Home Medications    Prior to Admission medications   Medication Sig Start Date End Date Taking? Authorizing Provider  albuterol  (VENTOLIN  HFA) 108 (90 Base) MCG/ACT inhaler Inhale 2 puffs into the lungs every 4 (four) hours as needed for wheezing or shortness of breath. 06/06/21   Piontek, Rocky, MD  buPROPion HCl (WELLBUTRIN PO) Take by mouth.    [provider]  Pantoprazole  Sodium (PROTONIX  PO) Take by mouth.    [provider]  promethazine -dextromethorphan (PROMETHAZINE -DM) 6.25-15 MG/5ML syrup Take 5 mLs by mouth 4 (four) times daily as needed for cough. 06/06/21   Piontek, Rocky, MD  QUEtiapine Fumarate (SEROQUEL PO) Take by mouth.    [provider]    Family History Family History  Problem Relation Age of Onset   Healthy Mother    Healthy Father      Social History Social History   Tobacco Use   Smoking status: Former    Types: E-cigarettes   Smokeless tobacco: Current  Vaping Use   Vaping status: Every Day  Substance Use Topics   Alcohol use: Yes    Comment: socially   Drug use: Not Currently    Frequency: 1.0 times per week    Types: Marijuana    Comment: clean from heroin and cocaine x 3 yrs     Allergies   Sulfa antibiotics and Other   Review of Systems Review of Systems Pertinent negatives listed in HPI   Physical Exam Triage Vital Signs ED Triage Vitals  Encounter Vitals Group     BP 02/14/23 1624 120/76     Systolic BP Percentile --      Diastolic BP Percentile --      Pulse Rate 02/14/23 1624 80     Resp 02/14/23 1624 18     Temp 02/14/23 1624 98.3 F (36.8 C)     Temp Source 02/14/23 1624 Oral     SpO2 02/14/23 1624 98 %     Weight 02/14/23 1623 175 lb (79.4 kg)     Height 02/14/23 1623 5' 8 (1.727 m)     Head Circumference --      Peak Flow --      Pain Score 02/14/23 1623  6     Pain Loc --      Pain Education --      Exclude from Growth Chart --    No data found.  Updated Vital Signs BP 120/76 (BP Location: Left Arm)   Pulse 80   Temp 98.3 F (36.8 C) (Oral)   Resp 18   Ht 5' 8 (1.727 m)   Wt 175 lb (79.4 kg)   SpO2 98%   BMI 26.61 kg/m   Visual Acuity Right Eye Distance:   Left Eye Distance:   Bilateral Distance:    Right Eye Near:   Left Eye Near:    Bilateral Near:     Physical Exam Vitals and nursing note reviewed.  Constitutional:      Appearance: Normal appearance.  HENT:     Head: Normocephalic and atraumatic.     Nose: Nose normal.  Eyes:     Extraocular Movements: Extraocular movements intact.     Pupils: Pupils are equal, round, and reactive to light.  Cardiovascular:     Rate and Rhythm: Normal rate and regular rhythm.  Musculoskeletal:     Right hand: Swelling, tenderness and bony tenderness present. Decreased range of motion. Decreased strength.      Left hand: Normal.  Skin:    General: Skin is warm and dry.  Neurological:     General: No focal deficit present.     Mental Status: He is alert.    UC Treatments / Results  Labs (all labs ordered are listed, but only abnormal results are displayed) Labs Reviewed - No data to display  EKG   Radiology DG Hand Complete Right Result Date: 02/14/2023 CLINICAL DATA:  Fall on right hand 02/10/2023. Unable to move fifth finger. Unable to lift fourth finger. Pain in second and third digits. EXAM: RIGHT HAND - COMPLETE 3+ VIEW COMPARISON:  Right hand radiographs 10/11/2022 FINDINGS: Interval healing of the prior nondisplaced fracture of the base of the fourth metacarpal. Mild mineralization at the dorsomedial aspect of the fifth carpometacarpal joint is unchanged from prior and chronic. Mild ulnar negative variance, unchanged. Mild cortical thickening at the volar proximal aspect of the middle phalanx of the index finger on lateral view, unchanged from prior. No acute fracture is seen.  No dislocation. IMPRESSION: 1. Interval healing of the prior nondisplaced fracture of the base of the fourth metacarpal. 2. No acute fracture is seen. Electronically Signed   By: Tanda Lyons M.D.   On: 02/14/2023 17:08    Procedures Procedures (including critical care time)  Medications Ordered in UC Medications - No data to display  Initial Impression / Assessment and Plan / UC Course  I have reviewed the triage vital signs and the nursing notes.  Pertinent labs & imaging results that were available during my care of the patient were reviewed by me and considered in my medical decision making (see chart for details).    Injury of the right hand, no acute fracture although radiologist marks that fracture involving the fourth joint has not completely resolved.  Patient is having significant tenderness, bruising range of motion of the fourth digit, recommended ibuprofen  for pain and for patient to contact  the orthopedic hand specialist that he saw previously related to this injury.  Advised that he needs additional evaluation by specialist ensure that his hand is healing appropriately.  Patient verbalized understanding and agreement with plan. Final Clinical Impressions(s) / UC Diagnoses   Final diagnoses:  Injury of right hand, initial encounter  Discharge Instructions      Come in wearing the hand splint that you have at home while working from any duties around the house to prevent reinjury to your hand.  Follow-up with a hand specialist that you saw previously according to imaging the fracture you sustained back in September has not completely healed and you need further evaluation and follow-up to determine course of action and any complications involving your hand. May take ibuprofen  or Aleve  as needed for pain.     ED Prescriptions   None    PDMP not reviewed this encounter.   Arloa Suzen RAMAN, NP 02/14/23 [id number]

## 2023-11-14 ENCOUNTER — Ambulatory Visit: Payer: Self-pay

## 2023-11-14 ENCOUNTER — Ambulatory Visit: Admission: EM | Admit: 2023-11-14 | Discharge: 2023-11-14 | Disposition: A | Discharge status: Home / Self Care

## 2023-11-14 DIAGNOSIS — I10 Essential (primary) hypertension: Secondary | ICD-10-CM | POA: Diagnosis not present

## 2023-11-14 DIAGNOSIS — M92522 Juvenile osteochondrosis of tibia tubercle, left leg: Secondary | ICD-10-CM

## 2023-11-14 DIAGNOSIS — K219 Gastro-esophageal reflux disease without esophagitis: Secondary | ICD-10-CM | POA: Diagnosis not present

## 2023-11-14 MED ORDER — PANTOPRAZOLE SODIUM 20 MG PO TBEC: 20.0000 mg | DELAYED_RELEASE_TABLET | Freq: Every day | ORAL | 2 refills | Status: DC

## 2023-11-14 NOTE — ED Triage Notes (Signed)
 The patient presents with concerns regarding blood pressure (BP). Over the past 5 days, the patient has been monitoring BP using a new device, with a recorded highest reading of 175/110 mmHg approximately 6 PM several days ago. The patient has a prior diagnosis of hypertension but is currently not on any antihypertensive medications.  Additionally, the patient reports a palpable mass on the upper left leg, which they are concerned may be cancerous. The patient does not have a primary care provider (PCP) at this time but has an upcoming appointment scheduled for December 13, 2023, at [facility].

## 2023-11-14 NOTE — Telephone Encounter (Signed)
 Patient will go to [facility] for acute assessment. Scheduled future New Pt visit at [facility].  FYI Only or Action Required?: FYI only for provider.  Patient was last seen in primary care on N/A.  Called Nurse Triage reporting Hypertension.  Symptoms began several days ago.  Interventions attempted: Dietary changes.  Symptoms are: unchanged.  Triage Disposition: See PCP When Office is Open (Within 3 Days)  Patient/caregiver understands and will follow disposition?: Yes Reason for Disposition  Systolic BP >= 160 OR Diastolic >= 100  Answer Assessment - Initial Assessment Questions 1. BLOOD PRESSURE: What is your blood pressure? Did you take at least two measurements 5 minutes apart?     Approx 170/100s x 5 days  2. ONSET: When did you take your blood pressure?     5 days  3. HOW: How did you take your blood pressure? (e.g., automatic home BP monitor, visiting nurse)     Machine  4. HISTORY: Do you have a history of high blood pressure?     Yes, was in the 130s at that time. Pt lost weight, decreased salt intake and thought it resolved.  5. MEDICINES: Are you taking any medicines for blood pressure? Have you missed any doses recently?     Denies  6. OTHER SYMPTOMS: Do you have any symptoms? 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 Topic: Clinical - Red Word Triage >> Nov 14, 2023  8:07 AM Amy B wrote: Red Word that prompted transfer to Nurse Triage: High blood pressure, low energy

## 2023-11-14 NOTE — Discharge Instructions (Signed)
  1. Essential hypertension (Primary) - Continue monitoring blood pressure at home, if blood pressure readings are severely elevated and you are experiencing chest pain, shortness of breath, weakness, dizziness, blurred vision, severe headache, altered mental status follow-up in the emergency department immediately. - Follow-up with primary care provider in November as scheduled and discuss management of ongoing hypertension.  2. Gastroesophageal reflux disease without esophagitis - pantoprazole (PROTONIX) 20 MG tablet; Take 1 tablet (20 mg total) by mouth daily.  Dispense: 30 tablet; Refill: 2 - For breakthrough heartburn symptoms, take over-the-counter Pepcid Complete chewable tablet to help eliminate excessive heartburn.  3. Osgood-Schlatter's disease, left - Continue to monitor left knee pain for worsening severity, if you experience worsening pain I would like to see orthopedics for evaluation and possible need for medical intervention, speak with primary care provider for referral.  -Continue to monitor symptoms for any change in severity if there is any escalation of current symptoms or development of new symptoms follow-up in ER for further evaluation and management.

## 2023-11-14 NOTE — ED Provider Notes (Signed)
 [facility]  Note:  This document was prepared using Conservation officer, historic buildings and may include unintentional dictation errors.  MRN: [id number] DOB: [birth year]  Subjective:   Gregg Holmes is a 33 y.o. male presenting for multiple elevated blood pressure readings at home over the last 4 to 5 days.  Patient reports that he does have history of high blood pressure but is not currently under the care of a primary care physician.  Patient reports that he had a reading of 175 110 which was the highest reading he is seen.  Patient denies any headache, blurry vision, chest pain, shortness of breath, weakness, dizziness.  Patient is also concerned about pain to his left anterior knee with a hardened bump below his kneecap.  Patient states there is increased pain when bending down or kneeling on his knees.  Patient was concern for possible infection or tumor.  Patient also reports that he is having recurrence of his previous gastroesophageal reflux.  Patient was previously placed on medication that seemed to help symptoms but he ran out and has not seen a primary care in several years.  Patient has appointment set up with new primary care on November 5.  No current facility-administered medications for this encounter.  Current Outpatient Medications:    Buprenorphine HCl-Naloxone HCl 2-0.5 MG FILM, Place under the tongue 2 (two) times daily., Disp: , Rfl:    Vilazodone HCl 20 MG TABS, Take 1 tablet by mouth every morning., Disp: , Rfl:    albuterol  (VENTOLIN  HFA) 108 (90 Base) MCG/ACT inhaler, Inhale 2 puffs into the lungs every 4 (four) hours as needed for wheezing or shortness of breath., Disp: 1 each, Rfl: 0   buPROPion HCl (WELLBUTRIN PO), Take by mouth., Disp: , Rfl:    pantoprazole (PROTONIX) 20 MG tablet, Take 1 tablet (20 mg total) by mouth daily., Disp: 30 tablet, Rfl: 2   promethazine -dextromethorphan (PROMETHAZINE -DM) 6.25-15 MG/5ML syrup, Take 5 mLs by mouth 4 (four) times  daily as needed for cough., Disp: 118 mL, Rfl: 0   QUEtiapine Fumarate (SEROQUEL PO), Take by mouth., Disp: , Rfl:    Allergies  Allergen Reactions   Sulfa Antibiotics Nausea And Vomiting    Other Reaction(s): vomiting   Other Rash and Dermatitis    metals    Past Medical History:  Diagnosis Date   ADHD (attention deficit hyperactivity disorder)    Cocaine abuse (HCC)    IV drug abuse (HCC)      History reviewed. No pertinent surgical history.  Family History  Problem Relation Age of Onset   Healthy Mother    Healthy Father     Social History   Tobacco Use   Smoking status: Never   Smokeless tobacco: Never  Vaping Use   Vaping status: Every Day   Substances: Nicotine , Flavoring  Substance Use Topics   Alcohol use: Yes    Comment: socially   Drug use: Not Currently    Frequency: 1.0 times per week    Types: Marijuana    Comment: clean from heroin and cocaine x 3 yrs    ROS Refer to HPI for ROS details.  Objective:   Vitals: BP 129/85 (BP Location: Left Arm)   Pulse (!) 102   Temp 98.2 F (36.8 C) (Oral)   Resp 18   Ht 5' 9 (1.753 m)   Wt 210 lb (95.3 kg)   SpO2 97%   BMI 31.01 kg/m   Physical Exam Vitals and nursing note reviewed.  Constitutional:      General: He is not in acute distress.    Appearance: Normal appearance. He is well-developed. He is not ill-appearing or toxic-appearing.  HENT:     Head: Normocephalic.  Cardiovascular:     Rate and Rhythm: Normal rate.  Pulmonary:     Effort: Pulmonary effort is normal. No respiratory distress.     Breath sounds: No stridor. No wheezing.  Abdominal:     Palpations: Abdomen is soft.     Tenderness: There is no abdominal tenderness. There is no right CVA tenderness or left CVA tenderness.  Musculoskeletal:        General: Normal range of motion.     Left knee: Bony tenderness present. No swelling, deformity or crepitus. Normal range of motion. Tenderness present. Normal alignment. Normal  pulse.  Skin:    General: Skin is warm and dry.  Neurological:     General: No focal deficit present.     Mental Status: He is alert and oriented to person, place, and time.  Psychiatric:        Mood and Affect: Mood normal.        Behavior: Behavior normal.     Procedures  No results found for this or any previous visit (from the past 24 hours).  No results found.   Assessment and Plan :     Discharge Instructions       1. Essential hypertension (Primary) - Continue monitoring blood pressure at home, if blood pressure readings are severely elevated and you are experiencing chest pain, shortness of breath, weakness, dizziness, blurred vision, severe headache, altered mental status follow-up in the emergency department immediately. - Follow-up with primary care provider in November as scheduled and discuss management of ongoing hypertension.  2. Gastroesophageal reflux disease without esophagitis - pantoprazole (PROTONIX) 20 MG tablet; Take 1 tablet (20 mg total) by mouth daily.  Dispense: 30 tablet; Refill: 2 - For breakthrough heartburn symptoms, take over-the-counter Pepcid Complete chewable tablet to help eliminate excessive heartburn.  3. Osgood-Schlatter's disease, left - Continue to monitor left knee pain for worsening severity, if you experience worsening pain I would like to see orthopedics for evaluation and possible need for medical intervention, speak with primary care provider for referral.  -Continue to monitor symptoms for any change in severity if there is any escalation of current symptoms or development of new symptoms follow-up in ER for further evaluation and management.       Seira Cody B Aldous Housel   Kenzi Bardwell, [place], [place] 11/14/23 1121

## 2023-12-13 ENCOUNTER — Ambulatory Visit

## 2023-12-13 VITALS — BP 140/102 | HR 108 | Temp 98.1°F | Ht 69.0 in | Wt 215.1 lb

## 2023-12-13 DIAGNOSIS — Z23 Encounter for immunization: Secondary | ICD-10-CM | POA: Diagnosis not present

## 2023-12-13 DIAGNOSIS — Z7689 Persons encountering health services in other specified circumstances: Secondary | ICD-10-CM | POA: Insufficient documentation

## 2023-12-13 DIAGNOSIS — Z13228 Encounter for screening for other metabolic disorders: Secondary | ICD-10-CM

## 2023-12-13 DIAGNOSIS — Z130 Encounter for screening for diseases of the blood and blood-forming organs and certain disorders involving the immune mechanism: Secondary | ICD-10-CM

## 2023-12-13 DIAGNOSIS — I10 Essential (primary) hypertension: Secondary | ICD-10-CM | POA: Insufficient documentation

## 2023-12-13 DIAGNOSIS — K219 Gastro-esophageal reflux disease without esophagitis: Secondary | ICD-10-CM | POA: Diagnosis not present

## 2023-12-13 DIAGNOSIS — Z1321 Encounter for screening for nutritional disorder: Secondary | ICD-10-CM

## 2023-12-13 DIAGNOSIS — F1111 Opioid abuse, in remission: Secondary | ICD-10-CM | POA: Diagnosis not present

## 2023-12-13 DIAGNOSIS — M92529 Juvenile osteochondrosis of tibia tubercle, unspecified leg: Secondary | ICD-10-CM | POA: Insufficient documentation

## 2023-12-13 DIAGNOSIS — R21 Rash and other nonspecific skin eruption: Secondary | ICD-10-CM | POA: Insufficient documentation

## 2023-12-13 DIAGNOSIS — Z1329 Encounter for screening for other suspected endocrine disorder: Secondary | ICD-10-CM

## 2023-12-13 DIAGNOSIS — M92522 Juvenile osteochondrosis of tibia tubercle, left leg: Secondary | ICD-10-CM

## 2023-12-13 MED ORDER — TRIAMCINOLONE ACETONIDE 0.5 % EX CREA: 1.0000 | TOPICAL_CREAM | Freq: Three times a day (TID) | CUTANEOUS | 0 refills | Status: DC

## 2023-12-13 MED ORDER — MELOXICAM 15 MG PO TABS: 15.0000 mg | ORAL_TABLET | Freq: Every day | ORAL | 0 refills | Status: DC

## 2023-12-13 MED ORDER — TRIAMCINOLONE ACETONIDE 0.1 % EX CREA: 1.0000 | TOPICAL_CREAM | Freq: Two times a day (BID) | CUTANEOUS | 0 refills | Status: AC

## 2023-12-13 MED ORDER — LOSARTAN POTASSIUM 25 MG PO TABS: 25.0000 mg | ORAL_TABLET | Freq: Every day | ORAL | 1 refills | Status: DC

## 2023-12-13 MED ORDER — PANTOPRAZOLE SODIUM 20 MG PO TBEC: 20.0000 mg | DELAYED_RELEASE_TABLET | Freq: Every day | ORAL | 1 refills | Status: DC

## 2023-12-13 NOTE — Assessment & Plan Note (Signed)
 Recent relapse on a substance similar to heroin that he got from a smoke shop called [employer], Self-managing withdrawal and relapse without additional support. - Offer resources for relapse support if desired in the future however patient declines resources at this time.  - Previously sober from heroin for 6 years

## 2023-12-13 NOTE — Assessment & Plan Note (Signed)
 Blood pressure elevated at home (170/105 mmHg). Losartan chosen for tolerability and minimal side effects. - Prescribe losartan 25 mg and titrate as needed - Provide a blood pressure log for home monitoring. - Schedule follow-up appointment in 8 weeks to assess blood pressure control.

## 2023-12-13 NOTE — Assessment & Plan Note (Signed)
 Chronic knee pain exacerbated by squatting and descending stairs. Referral to orthopedics deferred due to work commitments and potential surgical intervention. - Prescribe meloxicam 15 mg once daily with food. - Defer orthopedic referral unless pain becomes unmanageable or he decides to pursue further intervention.

## 2023-12-13 NOTE — Assessment & Plan Note (Signed)
 Managed with pantoprazole, which he wishes to continue. - Refill pantoprazole 20 mg prescription for 90 days.

## 2023-12-13 NOTE — Assessment & Plan Note (Signed)
 Flu vaccination due. Tetanus vaccination up to date. Previous screening for HIV and hepatitis C in rehab. Blood work to check overall health including thyroid function due to family history. - Administer flu shot. - Order non-fasting blood work including thyroid panel. - Review lab results and communicate any abnormalities via MyChart.

## 2023-12-13 NOTE — Assessment & Plan Note (Signed)
 Rash for two weeks on elbows, waistline, and back, itchy and scaly, suggestive of psoriasis and contact dermatitis. Triamcinolone chosen for stronger anti-inflammatory effects. - Prescribe triamcinolone 0.01%  cream for application twice daily. - Advise to report if rash worsens or does not improve.

## 2023-12-13 NOTE — Patient Instructions (Signed)
 VISIT SUMMARY: Today, we addressed your high blood pressure, recent substance use relapse, knee pain, itchy rash, and ongoing management of GERD. We also discussed general health maintenance, including vaccinations and blood work.  YOUR PLAN: HYPERTENSION: Your blood pressure readings at home have been high. -Start taking losartan at the lowest dose. We will adjust the dose as needed. -Keep a log of your blood pressure readings at home. -We will have a follow-up appointment in 8 weeks to check your blood pressure.  SUBSTANCE USE DISORDER, OPIOID, IN REMISSION WITH RECENT RELAPSE: You recently had a relapse on a substance similar to heroin. -We can provide resources for relapse support if you need them in the future. You declined these resources today.   RIGHT KNEE PAIN DUE TO OSGOOD-SCHLATTER DISEASE: You have chronic knee pain that gets worse with certain activities. -Take meloxicam once daily with food to help manage the pain. -We will defer an orthopedic referral for now unless the pain becomes unmanageable or you decide to seek further treatment.  RASH, LIKELY PSORIASIS AND CONTACT DERMATITIS: You have an itchy rash that has been present for two weeks. -Apply triamcinolone cream to the affected areas twice daily. -Let us  know if the rash gets worse or does not improve.  GASTROESOPHAGEAL REFLUX DISEASE (GERD): You are managing your GERD with pantoprazole. -Continue taking pantoprazole. We have refilled your prescription for 90 days.  GENERAL HEALTH MAINTENANCE: We discussed your vaccinations and need for blood work. -You received a flu shot today. -We ordered blood work, including a thyroid panel, to check your overall health. -We will review your lab results and communicate any abnormalities via MyChart.  If you have any problems before your next visit feel free to message me via MyChart (minor issues or questions) or call the office, otherwise you may reach out to schedule an office  visit.  Thank you! Saddie Sacks, PA-C

## 2023-12-13 NOTE — Progress Notes (Signed)
 New Patient Office Visit  Subjective    Patient ID: Gregg Holmes, male    DOB: 1990-02-12  Age: 33 y.o. MRN: [id number]  CC:  Chief Complaint  Patient presents with   New Patient (Initial Visit)    Discussed the use of AI scribe software for clinical note transcription with the patient, who gave verbal consent to proceed.  History of Present Illness Gregg Holmes is a 33 year old male who presents for primary care establishment and medication refills.  Hypertension and associated symptoms - Elevated blood pressure at home, with readings around 170/105 mmHg - Frequent headaches associated with elevated blood pressure  Substance use and withdrawal management - History of heroin addiction - Recent relapse on a substance called '7-OH' from a smoke shop after six years of sobriety - Previous rehabilitation on Suboxone for withdrawal symptom management. Not currently on Suboxone.  Knee pain and osgood-schlatter disease - Osgood-Schlatter disease causing significant left knee pain - Pain worsens with squatting and walking downstairs - No prior evaluation by orthopedist - Concern about missing work due to wife's upcoming delivery  Pruritic rash - Rash present for approximately two weeks - Distribution primarily on left elbow, stomach/waistline - Itchy sensation - Suspected triggers include metal allergies as he is exposed to metal frequently at work or sweating - Partial relief with hydrocortisone cream and Benadryl  Thyroid dysfunction - Family history of thyroid cancer (mother) - No current thyroid medication - Previous blood work indicated abnormal thyroid levels  Depression, anxiety, and heartburn - Current medications include vilazodone for depression and anxiety, and pantoprazole for heartburn - Vilazodone is prescribed by his psychiatrist    Screenings:  Colon Cancer: NA Lung Cancer: NA Breast Cancer: NA Diabetes: Checking A1c with labs  HLD: Checking lipid panel  with labs  The ASCVD Risk score (Arnett DK, et al., 2019) failed to calculate for the following reasons:   The 2019 ASCVD risk score is only valid for ages 54 to 68   Outpatient Encounter Medications as of 12/13/2023  Medication Sig   losartan (COZAAR) 25 MG tablet Take 1 tablet (25 mg total) by mouth daily.   meloxicam (MOBIC) 15 MG tablet Take 1 tablet (15 mg total) by mouth daily.   triamcinolone cream (KENALOG) 0.1 % Apply 1 Application topically 2 (two) times daily.   Vilazodone HCl 20 MG TABS Take 1 tablet by mouth every morning.   [DISCONTINUED] albuterol  (VENTOLIN  HFA) 108 (90 Base) MCG/ACT inhaler Inhale 2 puffs into the lungs every 4 (four) hours as needed for wheezing or shortness of breath.   [DISCONTINUED] Buprenorphine HCl-Naloxone HCl 2-0.5 MG FILM Place under the tongue 2 (two) times daily.   [DISCONTINUED] buPROPion HCl (WELLBUTRIN PO) Take by mouth.   [DISCONTINUED] pantoprazole (PROTONIX) 20 MG tablet Take 1 tablet (20 mg total) by mouth daily.   [DISCONTINUED] promethazine -dextromethorphan (PROMETHAZINE -DM) 6.25-15 MG/5ML syrup Take 5 mLs by mouth 4 (four) times daily as needed for cough.   [DISCONTINUED] QUEtiapine Fumarate (SEROQUEL PO) Take by mouth.   [DISCONTINUED] triamcinolone cream (KENALOG) 0.5 % Apply 1 Application topically 3 (three) times daily.   pantoprazole (PROTONIX) 20 MG tablet Take 1 tablet (20 mg total) by mouth daily.   No facility-administered encounter medications on file as of 12/13/2023.    Past Medical History:  Diagnosis Date   ADHD (attention deficit hyperactivity disorder)    Cocaine abuse (HCC)    IV drug abuse (HCC)     No past surgical history on file.  Family History  Problem Relation Age of Onset   Healthy Mother    Healthy Father     Social History   Socioeconomic History   Marital status: Single    Spouse name: Not on file   Number of children: Not on file   Years of education: Not on file   Highest education level:  Not on file  Occupational History   Not on file  Tobacco Use   Smoking status: Never   Smokeless tobacco: Never  Vaping Use   Vaping status: Every Day   Substances: Nicotine , Flavoring  Substance and Sexual Activity   Alcohol use: Yes    Comment: socially   Drug use: Not Currently    Frequency: 1.0 times per week    Types: Marijuana    Comment: clean from heroin and cocaine x 3 yrs   Sexual activity: Yes    Birth control/protection: None  Other Topics Concern   Not on file  Social History Narrative   Not on file   Social Drivers of Health   Financial Resource Strain: Not on file  Food Insecurity: No Food Insecurity (12/13/2023)   Hunger Vital Sign    Worried About Running Out of Food in the Last Year: Never true    Ran Out of Food in the Last Year: Never true  Transportation Needs: No Transportation Needs (12/13/2023)   PRAPARE - Administrator, Civil Service (Medical): No    Lack of Transportation (Non-Medical): No  Physical Activity: Not on file  Stress: Not on file (12/15/2022)  Social Connections: Not on file  Intimate Partner Violence: Not At Risk (12/13/2023)   Humiliation, Afraid, Rape, and Kick questionnaire    Fear of Current or Ex-Partner: No    Emotionally Abused: No    Physically Abused: No    Sexually Abused: No    ROS  Per HPI      Objective    BP (!) 140/102   Pulse (!) 108   Temp 98.1 F (36.7 C) (Oral)   Ht 5' 9 (1.753 m)   Wt 215 lb 1.3 oz (97.6 kg)   SpO2 99%   BMI 31.76 kg/m   Physical Exam Constitutional:      General: He is not in acute distress.    Appearance: Normal appearance.  Cardiovascular:     Rate and Rhythm: Normal rate and regular rhythm.     Heart sounds: Normal heart sounds. No murmur heard.    No friction rub. No gallop.  Pulmonary:     Effort: Pulmonary effort is normal. No respiratory distress.     Breath sounds: Normal breath sounds.  Musculoskeletal:        General: No swelling.     Cervical  back: Neck supple.  Lymphadenopathy:     Cervical: No cervical adenopathy.  Skin:    General: Skin is warm and dry.     Comments: Red, raised, flaking rash noted to left elbow and waistline. White patches are overlying in some areas.  Neurological:     General: No focal deficit present.     Mental Status: He is alert.  Psychiatric:        Mood and Affect: Mood normal.        Behavior: Behavior normal.        Thought Content: Thought content normal.          Assessment & Plan:   Screening for endocrine, nutritional, metabolic and immunity disorder -  Thyroid Panel With TSH; Future -     CBC with Differential/Platelet -     Comprehensive metabolic panel with GFR -     Lipid panel -     Hemoglobin A1c -     VITAMIN D 25 Hydroxy (Vit-D Deficiency, Fractures)  Gastroesophageal reflux disease without esophagitis Assessment & Plan: Managed with pantoprazole, which he wishes to continue. - Refill pantoprazole 20 mg prescription for 90 days.  Orders: -     Pantoprazole Sodium; Take 1 tablet (20 mg total) by mouth daily.  Dispense: 90 tablet; Refill: 1  Encounter for vaccination -     Flu vaccine trivalent PF, 6mos and older(Flulaval,Afluria,Fluarix,Fluzone)  Essential hypertension Assessment & Plan: Blood pressure elevated at home (170/105 mmHg). Losartan chosen for tolerability and minimal side effects. - Prescribe losartan 25 mg and titrate as needed - Provide a blood pressure log for home monitoring. - Schedule follow-up appointment in 8 weeks to assess blood pressure control.   Opioid use disorder, mild, in early remission, abuse (HCC) Assessment & Plan: Recent relapse on a substance similar to heroin that he got from a smoke shop called 7-OH, Self-managing withdrawal and relapse without additional support. - Offer resources for relapse support if desired in the future however patient declines resources at this time.  - Previously sober from heroin for 6 years     Osgood-Schlatter's disease of left lower extremity Assessment & Plan: Chronic knee pain exacerbated by squatting and descending stairs. Referral to orthopedics deferred due to work commitments and potential surgical intervention. - Prescribe meloxicam 15 mg once daily with food. - Defer orthopedic referral unless pain becomes unmanageable or he decides to pursue further intervention.   Rash Assessment & Plan: Rash for two weeks on elbows, waistline, and back, itchy and scaly, suggestive of psoriasis and contact dermatitis. Triamcinolone chosen for stronger anti-inflammatory effects. - Prescribe triamcinolone 0.01%  cream for application twice daily. - Advise to report if rash worsens or does not improve.   Encounter to establish care Assessment & Plan: Flu vaccination due. Tetanus vaccination up to date. Previous screening for HIV and hepatitis C in rehab. Blood work to check overall health including thyroid function due to family history. - Administer flu shot. - Order non-fasting blood work including thyroid panel. - Review lab results and communicate any abnormalities via MyChart.   Other orders -     Losartan Potassium; Take 1 tablet (25 mg total) by mouth daily.  Dispense: 90 tablet; Refill: 1 -     Meloxicam; Take 1 tablet (15 mg total) by mouth daily.  Dispense: 30 tablet; Refill: 0 -     Triamcinolone Acetonide; Apply 1 Application topically 2 (two) times daily.  Dispense: 60 g; Refill: 0    Return in about 6 weeks (around 01/24/2024) for HTN.   Saddie JULIANNA Sacks, PA-C

## 2023-12-14 ENCOUNTER — Ambulatory Visit: Payer: Self-pay

## 2023-12-14 LAB — COMPREHENSIVE METABOLIC PANEL WITH GFR
ALT: 18 IU/L (ref 0–44)
AST: 19 IU/L (ref 0–40)
Albumin: 5 g/dL (ref 4.1–5.1)
Alkaline Phosphatase: 96 IU/L (ref 47–123)
BUN/Creatinine Ratio: 10 (ref 9–20)
BUN: 11 mg/dL (ref 6–20)
Bilirubin Total: 0.6 mg/dL (ref 0.0–1.2)
CO2: 25 mmol/L (ref 20–29)
Calcium: 10 mg/dL (ref 8.7–10.2)
Chloride: 99 mmol/L (ref 96–106)
Creatinine, Ser: 1.12 mg/dL (ref 0.76–1.27)
Globulin, Total: 2.9 g/dL (ref 1.5–4.5)
Glucose: 85 mg/dL (ref 70–99)
Potassium: 4.4 mmol/L (ref 3.5–5.2)
Sodium: 140 mmol/L (ref 134–144)
Total Protein: 7.9 g/dL (ref 6.0–8.5)
eGFR: 90 mL/min/1.73 (ref >59)

## 2023-12-14 LAB — CBC WITH DIFFERENTIAL/PLATELET
Basophils Absolute: 0.1 x10E3/uL (ref 0.0–0.2)
Basos: 1 %
EOS (ABSOLUTE): 0.2 x10E3/uL (ref 0.0–0.4)
Eos: 3 %
Hematocrit: 45.2 % (ref 37.5–51.0)
Hemoglobin: 15.1 g/dL (ref 13.0–17.7)
Immature Grans (Abs): 0 x10E3/uL (ref 0.0–0.1)
Immature Granulocytes: 0 %
Lymphocytes Absolute: 1.6 x10E3/uL (ref 0.7–3.1)
Lymphs: 27 %
MCH: 28.1 pg (ref 26.6–33.0)
MCHC: 33.4 g/dL (ref 31.5–35.7)
MCV: 84 fL (ref 79–97)
Monocytes Absolute: 0.4 x10E3/uL (ref 0.1–0.9)
Monocytes: 7 %
Neutrophils Absolute: 3.6 x10E3/uL (ref 1.4–7.0)
Neutrophils: 62 %
Platelets: 364 x10E3/uL (ref 150–450)
RBC: 5.37 x10E6/uL (ref 4.14–5.80)
RDW: 13.5 % (ref 11.6–15.4)
WBC: 5.9 x10E3/uL (ref 3.4–10.8)

## 2023-12-14 LAB — LIPID PANEL
Chol/HDL Ratio: 6.9 ratio — ABNORMAL HIGH (ref 0.0–5.0)
Cholesterol, Total: 186 mg/dL (ref 100–199)
HDL: 27 mg/dL — ABNORMAL LOW (ref >39)
LDL Chol Calc (NIH): 104 mg/dL — ABNORMAL HIGH (ref 0–99)
Triglycerides: 320 mg/dL — ABNORMAL HIGH (ref 0–149)
VLDL Cholesterol Cal: 55 mg/dL — ABNORMAL HIGH (ref 5–40)

## 2023-12-14 LAB — THYROID PANEL WITH TSH
Free Thyroxine Index: 2 (ref 1.2–4.9)
T3 Uptake Ratio: 30 % (ref 24–39)
T4, Total: 6.7 ug/dL (ref 4.5–12.0)
TSH: 0.815 u[IU]/mL (ref 0.450–4.500)

## 2023-12-14 LAB — VITAMIN D 25 HYDROXY (VIT D DEFICIENCY, FRACTURES): Vit D, 25-Hydroxy: 19.2 ng/mL — ABNORMAL LOW (ref 30.0–100.0)

## 2023-12-14 LAB — HEMOGLOBIN A1C
Est. average glucose Bld gHb Est-mCnc: 103 mg/dL
Hgb A1c MFr Bld: 5.2 % (ref 4.8–5.6)

## 2023-12-14 MED ORDER — CHOLECALCIFEROL 1.25 MG (50000 UT) PO TABS: 50000.0000 [IU] | ORAL_TABLET | ORAL | 0 refills | Status: DC

## 2024-02-07 ENCOUNTER — Ambulatory Visit

## 2024-02-07 VITALS — BP 140/92 | HR 94 | Temp 98.3°F | Ht 69.0 in | Wt 213.0 lb

## 2024-02-07 DIAGNOSIS — K219 Gastro-esophageal reflux disease without esophagitis: Secondary | ICD-10-CM | POA: Diagnosis not present

## 2024-02-07 DIAGNOSIS — H66002 Acute suppurative otitis media without spontaneous rupture of ear drum, left ear: Secondary | ICD-10-CM

## 2024-02-07 DIAGNOSIS — R058 Other specified cough: Secondary | ICD-10-CM

## 2024-02-07 DIAGNOSIS — H6121 Impacted cerumen, right ear: Secondary | ICD-10-CM | POA: Diagnosis not present

## 2024-02-07 DIAGNOSIS — I10 Essential (primary) hypertension: Secondary | ICD-10-CM

## 2024-02-07 DIAGNOSIS — M92522 Juvenile osteochondrosis of tibia tubercle, left leg: Secondary | ICD-10-CM

## 2024-02-07 DIAGNOSIS — R062 Wheezing: Secondary | ICD-10-CM

## 2024-02-07 DIAGNOSIS — F1111 Opioid abuse, in remission: Secondary | ICD-10-CM | POA: Diagnosis not present

## 2024-02-07 MED ORDER — AMOXICILLIN-POT CLAVULANATE 875-125 MG PO TABS: 1.0000 | ORAL_TABLET | Freq: Two times a day (BID) | ORAL | 0 refills | Status: AC

## 2024-02-07 MED ORDER — PANTOPRAZOLE SODIUM 20 MG PO TBEC: 20.0000 mg | DELAYED_RELEASE_TABLET | Freq: Every day | ORAL | 1 refills | Status: AC

## 2024-02-07 MED ORDER — LOSARTAN POTASSIUM 25 MG PO TABS: 25.0000 mg | ORAL_TABLET | Freq: Every day | ORAL | 1 refills | Status: DC

## 2024-02-07 MED ORDER — METHYLPREDNISOLONE 4 MG PO TBPK: ORAL_TABLET | ORAL | 0 refills | Status: AC

## 2024-02-07 MED ORDER — IBUPROFEN 800 MG PO TABS: 800.0000 mg | ORAL_TABLET | Freq: Three times a day (TID) | ORAL | 2 refills | Status: AC | PRN

## 2024-02-07 MED ORDER — LOSARTAN POTASSIUM 50 MG PO TABS: 50.0000 mg | ORAL_TABLET | Freq: Every day | ORAL | 2 refills | Status: AC

## 2024-02-07 MED ORDER — VITAMIN D3 125 MCG (5000 UT) PO CAPS: 5000.0000 [IU] | ORAL_CAPSULE | Freq: Every day | ORAL | 2 refills | Status: AC

## 2024-02-07 MED ORDER — VILAZODONE HCL 20 MG PO TABS: 1.0000 | ORAL_TABLET | Freq: Every morning | ORAL | 2 refills | Status: AC

## 2024-02-07 NOTE — Patient Instructions (Addendum)
 VISIT SUMMARY: During your visit, we addressed your ear congestion and hearing difficulties, chronic cough, hypertension, knee pain, opioid use disorder, and other health concerns. We provided treatments and referrals to help manage these issues.  YOUR PLAN: RIGHT EAR INFECTION WITH CERUMEN IMPACTION: You have an ear infection and wax buildup in your right ear causing muffled hearing and pain. -We flushed your right ear to remove wax and fluid. -Take Augmentin 875 mg twice daily for 7 days with food to prevent stomach upset. -Be aware of potential side effects of Augmentin, including diarrhea.  CHRONIC COUGH WITH UPPER AIRWAY INFLAMMATION: You have a persistent cough likely due to a viral infection. -Take prednisone in the morning with food until package is complete.  -Do not use prednisone with meloxicam  or ibuprofen  to avoid kidney issues. -Monitor your cough and report if symptoms do not improve.  HYPERTENSION: Your blood pressure is slightly elevated but improved with current medication. -We rechecked your blood pressure before you left the clinic. -Continue your current antihypertensive medication unless your blood pressure is higher upon recheck.  CHRONIC KNEE PAIN: You have ongoing knee pain that initially responded to ibuprofen  but has become less effective. -Take prescription-strength ibuprofen  800 mg. -Stay hydrated and be aware of potential kidney effects of ibuprofen .  OPIOID DEPENDENCE, IN REMISSION: You are in remission from opioid dependence and interested in Vivitrol for maintenance therapy. -We referred you to [facility] clinic in [place] for Vivitrol/Suboxone management. -Provided contact information for [facility]. -We offered alternative resources if [facility] does not accept your insurance.  VITAMIN D  DEFICIENCY: You have a history of vitamin D  deficiency. -Take vitamin D  5000 IU daily. -You can purchase vitamin D  over-the-counter if the prescription is  not covered.  GASTROESOPHAGEAL REFLUX DISEASE (GERD): Your GERD is managed with pantoprazole , but current symptoms may not be related to GERD. -Continue taking pantoprazole  as prescribed.  If you have any problems before your next visit feel free to message me via MyChart (minor issues or questions) or call the office, otherwise you may reach out to schedule an office visit.  Thank you! Saddie Sacks, PA-C

## 2024-02-08 DIAGNOSIS — R058 Other specified cough: Secondary | ICD-10-CM | POA: Insufficient documentation

## 2024-02-08 DIAGNOSIS — H6690 Otitis media, unspecified, unspecified ear: Secondary | ICD-10-CM | POA: Insufficient documentation

## 2024-02-08 MED ORDER — NALOXONE HCL 4 MG/0.1ML NA LIQD: NASAL | 0 refills | Status: AC

## 2024-02-08 NOTE — Assessment & Plan Note (Signed)
 BP goal < 140/90.  BP above goal in office and on recheck, but improved from previous.  - Will increase Losartan  to 50 mg daily and reassess in a few weeks - Provided with BP log and encouraged to check BP at home  - will need BMP at next visit.

## 2024-02-08 NOTE — Assessment & Plan Note (Signed)
 Chronic knee pain exacerbated by squatting and descending stairs. Referral to orthopedics deferred due to work commitments. Feels that mobic  is no longer effective.  - Change to 800 mg ibuprofen  q 4-6 hours PRN  - Advised not to take this medication or the Mobic  while on methylprednisolone for URI

## 2024-02-08 NOTE — Assessment & Plan Note (Signed)
"   Previous albuterol  use caused anxiety and jitteriness. - Prescribed prednisone, to be taken in the morning with food. - Advised against concurrent use of prednisone with meloxicam  or ibuprofen  due to potential kidney issues. - Instructed to monitor cough and report if symptoms do not improve. "

## 2024-02-08 NOTE — Assessment & Plan Note (Signed)
 Right ear infection with muffled hearing and pain. Cerumen impaction noted. - Flushed right ear to remove wax and fluid. - Prescribed Augmentin 875 mg twice daily for 7 days. - Advised taking Augmentin with food to prevent stomach upset. - Discussed potential side effects of Augmentin, including diarrhea.

## 2024-02-08 NOTE — Progress Notes (Signed)
 "  Established Patient Office Visit  Subjective   Patient ID: Gregg Holmes, male    DOB: 1991/01/28  Age: 34 y.o. MRN: [id number]  Chief Complaint  Patient presents with   Medical Management of Chronic Issues    HPI  History of Present Illness   Gregg Holmes is a 34 year old male who presents with ear congestion and hearing difficulties.  Right ear congestion and hearing loss - Ear congestion and hearing difficulties in the right ear for the past few days - Sensation of ear being 'stopped up' - Attempted to clean ear with Q-tip, which worsened symptoms - Using eardrops in the affected ear for the past four days - Desires ear irrigation due to sensation of something lodged in the ear - No fever - Muffled hearing and pain with pressure to the ear Chronic cough - Persistent cough for approximately one month - Cough worsens at night, causing awakening and coughing until emesis. Feels that he has been wheezing some at bedtime. Has used albuterol  inhalers before but reports that they make him very jittery and anxious. Would like to avoid inhalers.  - History of GERD - Previously managed GERD symptoms with pantoprazole , but current symptoms are not well controlled  Hypertension - History of hypertension - Currently taking losartan  25 mg daily. Reports compliance with this medication. No HA, CP, vision changes, edema, SOB.  - Not monitoring blood pressure at home despite having two machines  Opioid use disorder - Recent relapse on a substance called 7-OH that he is obtaining from smoke shops  - Seeking referral to a Suboxone clinic for assistance with tapering and transition to Suboxone  - Recent change in insurance has affected access to psychiatric care and medication refills  Knee pain - Chronic knee pain due to Osgood-Schlatter disease  - Using meloxicam , but efficacy has decreased over time         ROS Per HPI.    Objective:     BP (!) 140/92   Pulse 94   Temp 98.3 F  (36.8 C) (Oral)   Ht 5' 9 (1.753 m)   Wt 213 lb 0.6 oz (96.6 kg)   SpO2 98%   BMI 31.46 kg/m    Physical Exam Constitutional:      General: He is not in acute distress.    Appearance: Normal appearance.  HENT:     Right Ear: Hearing and external ear normal. Drainage present. Tympanic membrane is injected and erythematous.     Left Ear: Hearing, tympanic membrane and external ear normal.     Ears:     Comments: There is a moderate amount of cerumen in the right ear that is non-impacting.  Cardiovascular:     Rate and Rhythm: Normal rate and regular rhythm.     Heart sounds: Normal heart sounds. No murmur heard.    No friction rub. No gallop.  Pulmonary:     Effort: Pulmonary effort is normal. No respiratory distress.     Breath sounds: Normal breath sounds.  Musculoskeletal:        General: No swelling.     Cervical back: Neck supple.  Lymphadenopathy:     Cervical: No cervical adenopathy.  Skin:    General: Skin is warm and dry.  Neurological:     General: No focal deficit present.     Mental Status: He is alert.  Psychiatric:        Mood and Affect: Mood normal.  Behavior: Behavior normal.        Thought Content: Thought content normal.      No results found for any visits on 02/07/24.  Last CBC Lab Results  Component Value Date   WBC 5.9 12/13/2023   HGB 15.1 12/13/2023   HCT 45.2 12/13/2023   MCV 84 12/13/2023   MCH 28.1 12/13/2023   RDW 13.5 12/13/2023   PLT 364 12/13/2023   Last metabolic panel Lab Results  Component Value Date   GLUCOSE 85 12/13/2023   NA 140 12/13/2023   K 4.4 12/13/2023   CL 99 12/13/2023   CO2 25 12/13/2023   BUN 11 12/13/2023   CREATININE 1.12 12/13/2023   EGFR 90 12/13/2023   CALCIUM 10.0 12/13/2023   PROT 7.9 12/13/2023   ALBUMIN 5.0 12/13/2023   LABGLOB 2.9 12/13/2023   BILITOT 0.6 12/13/2023   ALKPHOS 96 12/13/2023   AST 19 12/13/2023   ALT 18 12/13/2023   ANIONGAP 16 (H) 08/21/2013   Last lipids Lab  Results  Component Value Date   CHOL 186 12/13/2023   HDL 27 (L) 12/13/2023   LDLCALC 104 (H) 12/13/2023   TRIG 320 (H) 12/13/2023   CHOLHDL 6.9 (H) 12/13/2023   Last hemoglobin A1c Lab Results  Component Value Date   HGBA1C 5.2 12/13/2023   Last thyroid  functions Lab Results  Component Value Date   TSH 0.815 12/13/2023   T4TOTAL 6.7 12/13/2023   Last vitamin D  Lab Results  Component Value Date   VD25OH 19.2 (L) 12/13/2023      The ASCVD Risk score (Arnett DK, et al., 2019) failed to calculate for the following reasons:   The 2019 ASCVD risk score is only valid for ages 61 to 37    Assessment & Plan:   Osgood-Schlatter's disease of left lower extremity Assessment & Plan: Chronic knee pain exacerbated by squatting and descending stairs. Referral to orthopedics deferred due to work commitments. Feels that mobic  is no longer effective.  - Change to 800 mg ibuprofen  q 4-6 hours PRN  - Advised not to take this medication or the Mobic  while on methylprednisolone for URI   Gastroesophageal reflux disease without esophagitis -     Pantoprazole  Sodium; Take 1 tablet (20 mg total) by mouth daily.  Dispense: 90 tablet; Refill: 1  Opioid use disorder, mild, in early remission, abuse (HCC) Assessment & Plan: Recent relapse on a substance similar to heroin that he got from a smoke shop called [employer], Self-managing withdrawal and relapse without additional support.  - Advised cessation - Will send in Narcan for patient for risk reduction while he gets established with a Suboxone clinic - Offered resources for Suboxone clinics in [place] including [place].  - Will also place referral to [facility]  Orders: -     Naloxone HCl; Once spray in a nostril to reduce effects of opioids  Dispense: 1 each; Refill: 0  Essential hypertension Assessment & Plan: BP goal < 140/90.  BP above goal in office and on recheck, but improved from previous.  - Will increase Losartan  to 50 mg  daily and reassess in a few weeks - Provided with BP log and encouraged to check BP at home  - will need BMP at next visit.    Non-recurrent acute suppurative otitis media of left ear without spontaneous rupture of tympanic membrane Assessment & Plan: Right ear infection with muffled hearing and pain. Cerumen impaction noted. - Flushed right ear to remove wax and fluid. - Prescribed Augmentin  875 mg twice daily for 7 days. - Advised taking Augmentin with food to prevent stomach upset. - Discussed potential side effects of Augmentin, including diarrhea.   Nocturnal cough with wheeze Assessment & Plan:  Previous albuterol  use caused anxiety and jitteriness. - Prescribed prednisone, to be taken in the morning with food. - Advised against concurrent use of prednisone with meloxicam  or ibuprofen  due to potential kidney issues. - Instructed to monitor cough and report if symptoms do not improve.   Other orders -     Vilazodone HCl; Take 1 tablet (20 mg total) by mouth every morning.  Dispense: 30 tablet; Refill: 2 -     Vitamin D3; Take 1 capsule (5,000 Units total) by mouth daily.  Dispense: 30 capsule; Refill: 2 -     Ibuprofen ; Take 1 tablet (800 mg total) by mouth every 8 (eight) hours as needed for moderate pain (pain score 4-6).  Dispense: 30 tablet; Refill: 2 -     Amoxicillin-Pot Clavulanate; Take 1 tablet by mouth 2 (two) times daily for 7 days.  Dispense: 14 tablet; Refill: 0 -     methylPREDNISolone; Take medication as directed by instructions on dosing pack. START on 02/08/24  Dispense: 1 each; Refill: 0 -     Losartan  Potassium; Take 1 tablet (50 mg total) by mouth daily.  Dispense: 90 tablet; Refill: 2    Return in about 6 months (around 08/06/2024) for HTN, Mood.    Saddie JULIANNA Sacks, PA-C "

## 2024-02-08 NOTE — Assessment & Plan Note (Addendum)
 Recent relapse on a substance similar to heroin that he got from a smoke shop called [employer], Self-managing withdrawal and relapse without additional support.  - Advised cessation - Will send in Narcan for patient for risk reduction while he gets established with a Suboxone clinic - Offered resources for Suboxone clinics in [place] including [place].  - Will also place referral to [facility]

## 2024-08-12 ENCOUNTER — Ambulatory Visit
